# Patient Record
Sex: Male | Born: 1970 | Race: White | Hispanic: No
Health system: Midwestern US, Community
[De-identification: ages and names within clinical notes are randomized; demographics above are authoritative.]

## PROBLEM LIST (undated history)

## (undated) DIAGNOSIS — F329 Major depressive disorder, single episode, unspecified: Secondary | ICD-10-CM

## (undated) DIAGNOSIS — B1920 Unspecified viral hepatitis C without hepatic coma: Secondary | ICD-10-CM

## (undated) DIAGNOSIS — F1910 Other psychoactive substance abuse, uncomplicated: Secondary | ICD-10-CM

## (undated) DIAGNOSIS — F4310 Post-traumatic stress disorder, unspecified: Secondary | ICD-10-CM

## (undated) DIAGNOSIS — F32A Depression, unspecified: Secondary | ICD-10-CM

## (undated) DIAGNOSIS — F3181 Bipolar II disorder: Secondary | ICD-10-CM

## (undated) DIAGNOSIS — E119 Type 2 diabetes mellitus without complications: Secondary | ICD-10-CM

## (undated) DIAGNOSIS — M5030 Other cervical disc degeneration, unspecified cervical region: Secondary | ICD-10-CM

## (undated) HISTORY — PX: BACK SURGERY: SHX140

## (undated) HISTORY — PX: CHOLECYSTECTOMY: SHX55

---

## 2015-05-20 ENCOUNTER — Emergency Department (HOSPITAL_COMMUNITY)
Admission: EM | Admit: 2015-05-20 | Discharge: 2015-05-20 | Disposition: A | Discharge status: Home / Self Care | Payer: Medicare Other | Attending: Emergency Medicine | Admitting: Emergency Medicine

## 2015-05-20 ENCOUNTER — Encounter (HOSPITAL_COMMUNITY): Payer: Self-pay | Admitting: Emergency Medicine

## 2015-05-20 ENCOUNTER — Encounter (HOSPITAL_COMMUNITY): Payer: Self-pay

## 2015-05-20 ENCOUNTER — Emergency Department (HOSPITAL_COMMUNITY)
Admission: EM | Admit: 2015-05-20 | Discharge: 2015-05-21 | Disposition: A | Discharge status: Home / Self Care | Payer: Medicare Other | Attending: Emergency Medicine | Admitting: Emergency Medicine

## 2015-05-20 DIAGNOSIS — R45851 Suicidal ideations: Secondary | ICD-10-CM | POA: Insufficient documentation

## 2015-05-20 DIAGNOSIS — K9189 Other postprocedural complications and disorders of digestive system: Secondary | ICD-10-CM | POA: Diagnosis not present

## 2015-05-20 DIAGNOSIS — F10920 Alcohol use, unspecified with intoxication, uncomplicated: Secondary | ICD-10-CM

## 2015-05-20 DIAGNOSIS — Z590 Homelessness: Secondary | ICD-10-CM | POA: Insufficient documentation

## 2015-05-20 DIAGNOSIS — F1210 Cannabis abuse, uncomplicated: Secondary | ICD-10-CM | POA: Insufficient documentation

## 2015-05-20 DIAGNOSIS — F1310 Sedative, hypnotic or anxiolytic abuse, uncomplicated: Secondary | ICD-10-CM | POA: Diagnosis not present

## 2015-05-20 DIAGNOSIS — F10120 Alcohol abuse with intoxication, uncomplicated: Secondary | ICD-10-CM | POA: Insufficient documentation

## 2015-05-20 DIAGNOSIS — K668 Other specified disorders of peritoneum: Secondary | ICD-10-CM

## 2015-05-20 DIAGNOSIS — F329 Major depressive disorder, single episode, unspecified: Secondary | ICD-10-CM | POA: Diagnosis not present

## 2015-05-20 DIAGNOSIS — T8189XA Other complications of procedures, not elsewhere classified, initial encounter: Secondary | ICD-10-CM

## 2015-05-20 DIAGNOSIS — R1110 Vomiting, unspecified: Secondary | ICD-10-CM | POA: Diagnosis present

## 2015-05-20 HISTORY — DX: Depression, unspecified: F32.A

## 2015-05-20 HISTORY — DX: Major depressive disorder, single episode, unspecified: F32.9

## 2015-05-20 LAB — COMPREHENSIVE METABOLIC PANEL
ALT: 104 U/L — AB (ref 17–63)
AST: 103 U/L — ABNORMAL HIGH (ref 15–41)
Albumin: 3.3 g/dL — ABNORMAL LOW (ref 3.5–5.0)
Alkaline Phosphatase: 86 U/L (ref 38–126)
Anion gap: 12 (ref 5–15)
BUN: 10 mg/dL (ref 6–20)
CALCIUM: 8.9 mg/dL (ref 8.9–10.3)
CHLORIDE: 107 mmol/L (ref 101–111)
CO2: 20 mmol/L — ABNORMAL LOW (ref 22–32)
CREATININE: 0.88 mg/dL (ref 0.61–1.24)
Glucose, Bld: 256 mg/dL — ABNORMAL HIGH (ref 65–99)
Potassium: 4.2 mmol/L (ref 3.5–5.1)
Sodium: 139 mmol/L (ref 135–145)
TOTAL PROTEIN: 6.3 g/dL — AB (ref 6.5–8.1)
Total Bilirubin: 0.5 mg/dL (ref 0.3–1.2)

## 2015-05-20 LAB — CBC
HCT: 39.4 % (ref 39.0–52.0)
Hemoglobin: 13.2 g/dL (ref 13.0–17.0)
MCH: 32.6 pg (ref 26.0–34.0)
MCHC: 33.5 g/dL (ref 30.0–36.0)
MCV: 97.3 fL (ref 78.0–100.0)
PLATELETS: 373 10*3/uL (ref 150–400)
RBC: 4.05 MIL/uL — AB (ref 4.22–5.81)
RDW: 13.5 % (ref 11.5–15.5)
WBC: 9.5 10*3/uL (ref 4.0–10.5)

## 2015-05-20 LAB — ACETAMINOPHEN LEVEL: Acetaminophen (Tylenol), Serum: <10 ug/mL — ABNORMAL LOW (ref 10–30)

## 2015-05-20 LAB — SALICYLATE LEVEL

## 2015-05-20 LAB — ETHANOL

## 2015-05-20 NOTE — ED Notes (Signed)
wanded by security, staffing called for sitter, will send when one is available.  

## 2015-05-20 NOTE — ED Notes (Signed)
Pt from home for eval of SI that started yesterday, pt states has recently been released from jail and is homeless, pt states "everythiing is happening at once and I dont know what to do. I dont want to be here anymore." pt states plan of getting drunk and running out in front of traffic. Pt tearful in triage, calm and cooperative.

## 2015-05-20 NOTE — ED Notes (Signed)
Pt requesting to leave, states he will go to [facility] because he wants to be in a room and lay down. Pt informed would have to start process all over again when going to different ED. Pt states "I don't care" pt given belongings, staffing office made aware and charge RN. Pt ambulatory out of ED.

## 2015-05-20 NOTE — ED Notes (Signed)
[place]. C/o vomiting dark, brown blood and abdominal tenderness. Hx of gallbladder surgery on 2/14. Pt went out to drink alcohol with friends tonight. "Vomited after a few beers." Surgical sites in tact and not inflamed or red upon inspection.

## 2015-05-21 ENCOUNTER — Emergency Department (HOSPITAL_COMMUNITY): Payer: Medicare Other

## 2015-05-21 ENCOUNTER — Encounter (HOSPITAL_COMMUNITY): Payer: Self-pay

## 2015-05-21 DIAGNOSIS — F1210 Cannabis abuse, uncomplicated: Secondary | ICD-10-CM | POA: Diagnosis not present

## 2015-05-21 DIAGNOSIS — R45851 Suicidal ideations: Secondary | ICD-10-CM | POA: Diagnosis not present

## 2015-05-21 DIAGNOSIS — K9189 Other postprocedural complications and disorders of digestive system: Secondary | ICD-10-CM | POA: Diagnosis not present

## 2015-05-21 DIAGNOSIS — F1310 Sedative, hypnotic or anxiolytic abuse, uncomplicated: Secondary | ICD-10-CM | POA: Diagnosis not present

## 2015-05-21 DIAGNOSIS — F10120 Alcohol abuse with intoxication, uncomplicated: Secondary | ICD-10-CM | POA: Diagnosis not present

## 2015-05-21 DIAGNOSIS — R1110 Vomiting, unspecified: Secondary | ICD-10-CM | POA: Diagnosis present

## 2015-05-21 DIAGNOSIS — F329 Major depressive disorder, single episode, unspecified: Secondary | ICD-10-CM | POA: Diagnosis not present

## 2015-05-21 LAB — COMPREHENSIVE METABOLIC PANEL
ALBUMIN: 3.5 g/dL (ref 3.5–5.0)
ALK PHOS: 99 U/L (ref 38–126)
ALT: 110 U/L — AB (ref 17–63)
AST: 131 U/L — AB (ref 15–41)
Anion gap: 12 (ref 5–15)
BUN: 9 mg/dL (ref 6–20)
CALCIUM: 8.7 mg/dL — AB (ref 8.9–10.3)
CO2: 20 mmol/L — AB (ref 22–32)
CREATININE: 0.84 mg/dL (ref 0.61–1.24)
Chloride: 109 mmol/L (ref 101–111)
GFR calc non Af Amer: >60 mL/min (ref >60)
GLUCOSE: 128 mg/dL — AB (ref 65–99)
Potassium: 4 mmol/L (ref 3.5–5.1)
SODIUM: 141 mmol/L (ref 135–145)
Total Bilirubin: 0.5 mg/dL (ref 0.3–1.2)
Total Protein: 6.4 g/dL — ABNORMAL LOW (ref 6.5–8.1)

## 2015-05-21 LAB — CBC WITH DIFFERENTIAL/PLATELET
Basophils Absolute: 0.1 10*3/uL (ref 0.0–0.1)
Basophils Relative: 1 %
EOS ABS: 0.1 10*3/uL (ref 0.0–0.7)
Eosinophils Relative: 1 %
HCT: 38.3 % — ABNORMAL LOW (ref 39.0–52.0)
HEMOGLOBIN: 12.8 g/dL — AB (ref 13.0–17.0)
LYMPHS ABS: 4.2 10*3/uL — AB (ref 0.7–4.0)
Lymphocytes Relative: 40 %
MCH: 32.3 pg (ref 26.0–34.0)
MCHC: 33.4 g/dL (ref 30.0–36.0)
MCV: 96.7 fL (ref 78.0–100.0)
Monocytes Absolute: 0.8 10*3/uL (ref 0.1–1.0)
Monocytes Relative: 8 %
NEUTROS PCT: 50 %
Neutro Abs: 5.2 10*3/uL (ref 1.7–7.7)
Platelets: 402 10*3/uL — ABNORMAL HIGH (ref 150–400)
RBC: 3.96 MIL/uL — AB (ref 4.22–5.81)
RDW: 13.2 % (ref 11.5–15.5)
WBC: 10.5 10*3/uL (ref 4.0–10.5)

## 2015-05-21 LAB — RAPID URINE DRUG SCREEN, HOSP PERFORMED
Amphetamines: NOT DETECTED
Barbiturates: NOT DETECTED
Benzodiazepines: POSITIVE — AB
Cocaine: NOT DETECTED
OPIATES: NOT DETECTED
TETRAHYDROCANNABINOL: POSITIVE — AB

## 2015-05-21 LAB — ACETAMINOPHEN LEVEL: Acetaminophen (Tylenol), Serum: <10 ug/mL — ABNORMAL LOW (ref 10–30)

## 2015-05-21 LAB — ETHANOL: Alcohol, Ethyl (B): 127 mg/dL — ABNORMAL HIGH (ref <5)

## 2015-05-21 LAB — LIPASE, BLOOD: LIPASE: 30 U/L (ref 11–51)

## 2015-05-21 LAB — POC OCCULT BLOOD, ED: FECAL OCCULT BLD: NEGATIVE

## 2015-05-21 LAB — SALICYLATE LEVEL

## 2015-05-21 LAB — CBG MONITORING, ED: Glucose-Capillary: 112 mg/dL — ABNORMAL HIGH (ref 65–99)

## 2015-05-21 MED ORDER — METOCLOPRAMIDE HCL 5 MG/ML IJ SOLN
10.0000 mg | INTRAMUSCULAR | Status: AC
  Administered 2015-05-21: 10 mg via INTRAVENOUS
  Filled 2015-05-21: qty 2

## 2015-05-21 MED ORDER — FAMOTIDINE IN NACL 20-0.9 MG/50ML-% IV SOLN
20.0000 mg | Freq: Once | INTRAVENOUS | Status: AC
  Administered 2015-05-21: 20 mg via INTRAVENOUS
  Filled 2015-05-21: qty 50

## 2015-05-21 MED ORDER — HYDROMORPHONE HCL 1 MG/ML IJ SOLN
1.0000 mg | Freq: Once | INTRAMUSCULAR | Status: AC
  Administered 2015-05-21: 1 mg via INTRAMUSCULAR
  Filled 2015-05-21: qty 1

## 2015-05-21 MED ORDER — IOHEXOL 300 MG/ML  SOLN
100.0000 mL | Freq: Once | INTRAMUSCULAR | Status: AC | PRN
  Administered 2015-05-21: 100 mL via INTRAVENOUS

## 2015-05-21 MED ORDER — SODIUM CHLORIDE 0.9 % IV BOLUS (SEPSIS)
1000.0000 mL | Freq: Once | INTRAVENOUS | Status: AC
Start: 2015-05-21 — End: 2015-05-21
  Administered 2015-05-21: 1000 mL via INTRAVENOUS

## 2015-05-21 MED ORDER — HYDROMORPHONE HCL 1 MG/ML IJ SOLN
1.0000 mg | Freq: Once | INTRAMUSCULAR | Status: AC
  Administered 2015-05-21: 1 mg via INTRAVENOUS
  Filled 2015-05-21: qty 1

## 2015-05-21 MED ORDER — IOHEXOL 300 MG/ML  SOLN
50.0000 mL | Freq: Once | INTRAMUSCULAR | Status: AC | PRN
  Administered 2015-05-21: 50 mL via ORAL

## 2015-05-21 MED ORDER — MORPHINE SULFATE (PF) 4 MG/ML IV SOLN
4.0000 mg | Freq: Once | INTRAVENOUS | Status: AC
  Administered 2015-05-21: 4 mg via INTRAVENOUS
  Filled 2015-05-21: qty 1

## 2015-05-21 MED ORDER — DICYCLOMINE HCL 10 MG/ML IM SOLN
20.0000 mg | Freq: Once | INTRAMUSCULAR | Status: AC
  Administered 2015-05-21: 20 mg via INTRAMUSCULAR
  Filled 2015-05-21: qty 2

## 2015-05-21 MED ORDER — MORPHINE SULFATE (PF) 4 MG/ML IV SOLN
4.0000 mg | Freq: Once | INTRAVENOUS | Status: AC
  Administered 2015-05-21: 4 mg via INTRAMUSCULAR
  Filled 2015-05-21: qty 1

## 2015-05-21 MED ORDER — PANTOPRAZOLE SODIUM 40 MG IV SOLR
40.0000 mg | INTRAVENOUS | Status: AC
  Administered 2015-05-21: 40 mg via INTRAVENOUS
  Filled 2015-05-21: qty 40

## 2015-05-21 NOTE — ED Notes (Signed)
Bed: [facility] Expected date:  Expected time:  Means of arrival:  Comments: 

## 2015-05-21 NOTE — ED Notes (Signed)
During triage, pt expresses suicidal ideations.

## 2015-05-21 NOTE — ED Notes (Signed)
Pt belongings removed from room. They are at nurses station. One black book bag and on patient belongings bag containing a gray hoodie, a wallet, and jeans

## 2015-05-21 NOTE — ED Provider Notes (Signed)
CSN: [id number]     Arrival date & time 05/20/15  2350 History   First MD Initiated Contact with Patient 05/21/15 0100     Chief Complaint  Patient presents with  . Abdominal Pain  . Emesis     (Consider location/radiation/quality/duration/timing/severity/associated sxs/prior Treatment) HPI Comments: Patient is a 45 year old male with a history of depression who is 7 days status post cholecystectomy who presents to the ED for evaluation of constant, diffuse, aching, abdominal pain and hematemesis. Patient states that he has had similar abdominal pain every day since his surgery. He has been taking Percocet for this. Patient reports "drinking a few beers" with some friends tonight. He states that he had one episode of brown, dark bloody emesis prior to arrival. He states that he had a normal bowel movement today which was free of melena or hematochezia. He is also complaining of suicidal ideations. He has a plan to "get drunk and brought into traffic". He reports a history of suicide attempt by cutting. Patient denies fever, illicit drug use, urinary symptoms, or a hx of other abdominal surgeries. He states that he drinks 1/5 of liquor per day.  Surgery done in [place], [place] at [facility]. Surgeon, Dr. Adaline Sill.  The history is provided by the patient. No language interpreter was used.    Past Medical History  Diagnosis Date  . Depression    Past Surgical History  Procedure Laterality Date  . Cholecystectomy     History reviewed. No pertinent family history. Social History  Substance Use Topics  . Smoking status: Never Smoker   . Smokeless tobacco: None  . Alcohol Use: Yes     Comment: etoh abuse    Review of Systems  Constitutional: Negative for fever.  Gastrointestinal: Positive for nausea, vomiting and abdominal pain. Negative for diarrhea, constipation and blood in stool.  Genitourinary: Negative for dysuria.  Psychiatric/Behavioral: Positive for suicidal ideas  and behavioral problems.  All other systems reviewed and are negative.   Allergies  Seroquel and Tylenol  Home Medications   Prior to Admission medications   Not on File   BP 111/75 mmHg  Pulse 102  Temp(Src) 98.4 F (36.9 C) (Oral)  Resp 21  SpO2 96%   Physical Exam  Constitutional: He is oriented to person, place, and time. He appears well-developed and well-nourished. No distress.  Nontoxic/nonseptic appearing  HENT:  Head: Normocephalic and atraumatic.  Eyes: Conjunctivae and EOM are normal. No scleral icterus.  Neck: Normal range of motion.  Cardiovascular: Normal rate, regular rhythm and intact distal pulses.   Pulmonary/Chest: Effort normal and breath sounds normal. No respiratory distress. He has no wheezes. He has no rales.  Lungs CTAB  Abdominal: Soft. He exhibits no distension. There is tenderness. There is no rebound and no guarding.  Diffuse TTP. Abdomen soft, obese. No rigidity. No peritoneal signs. Bowel sounds slightly hypoactive. Surgical scars are C/D/I  Musculoskeletal: Normal range of motion.  Neurological: He is alert and oriented to person, place, and time. He exhibits normal muscle tone. Coordination normal.  Skin: Skin is warm and dry. No rash noted. He is not diaphoretic. No erythema. No pallor.  Psychiatric: His speech is normal. He is withdrawn. He exhibits a depressed mood. He expresses suicidal ideation. He expresses no homicidal ideation. He expresses suicidal plans. He expresses no homicidal plans.  Nursing note and vitals reviewed.   ED Course  Procedures (including critical care time) Labs Review Labs Reviewed  ACETAMINOPHEN LEVEL - Abnormal; Notable  for the following:    Acetaminophen (Tylenol), Serum <10 (*)    All other components within normal limits  URINE RAPID DRUG SCREEN, HOSP PERFORMED - Abnormal; Notable for the following:    Benzodiazepines POSITIVE (*)    Tetrahydrocannabinol POSITIVE (*)    All other components within  normal limits  ETHANOL - Abnormal; Notable for the following:    Alcohol, Ethyl (B) 127 (*)    All other components within normal limits  CBC WITH DIFFERENTIAL/PLATELET - Abnormal; Notable for the following:    RBC 3.96 (*)    Hemoglobin 12.8 (*)    HCT 38.3 (*)    Platelets 402 (*)    Lymphs Abs 4.2 (*)    All other components within normal limits  COMPREHENSIVE METABOLIC PANEL - Abnormal; Notable for the following:    CO2 20 (*)    Glucose, Bld 128 (*)    Calcium 8.7 (*)    Total Protein 6.4 (*)    AST 131 (*)    ALT 110 (*)    All other components within normal limits  CBG MONITORING, ED - Abnormal; Notable for the following:    Glucose-Capillary 112 (*)    All other components within normal limits  LIPASE, BLOOD  SALICYLATE LEVEL  POC OCCULT BLOOD, ED     Imaging Review Ct Abdomen Pelvis W Contrast  05/21/2015  CLINICAL DATA:  Abdominal pain and tenderness. Cholecystectomy 1 week prior. EXAM: CT ABDOMEN AND PELVIS WITH CONTRAST TECHNIQUE: Multidetector CT imaging of the abdomen and pelvis was performed using the standard protocol following bolus administration of intravenous contrast. CONTRAST:  OMNIPAQUE IOHEXOL 300 MG/ML  SOLN COMPARISON:  None. FINDINGS: Lower chest:  The included lung bases are clear. Liver: No focal lesion.  No intrahepatic fluid collection. Hepatobiliary: Postcholecystectomy with clips in the gallbladder fossa. Irregularly-shaped fluid collection in the gallbladder fossa measuring 6.7 x 5.0 x 4.7 cm contains small foci of air. No definite surrounding thick wall. Common bile duct measures 11 mm at the porta hepatis with normal tapering distally. No calcified choledocholithiasis. Pancreas: No ductal dilatation or inflammation. Spleen: Normal. Adrenal glands: No nodule. Kidneys: Symmetric renal enhancement and excretion. No hydronephrosis. Questionable nonobstructing stones in the left kidney versus early contrast excretion. Stomach/Bowel: Stomach  physiologically distended. There are no dilated or thickened small bowel loops. Small volume of stool throughout the colon without colonic wall thickening. The appendix is normal. Vascular/Lymphatic: No retroperitoneal adenopathy. Abdominal aorta is normal in caliber. Reproductive: Normal for age. Bladder: Physiologically distended. Other: New free intra-abdominal air. No ascites. Fat within both inguinal canals, left greater than right. Postsurgical change in the anterior abdominal wall without subcutaneous fluid collection. Musculoskeletal: There are no acute or suspicious osseous abnormalities. Postsurgical change at L5-S1. The left L5 pedicle screw abuts the superior endplate/intervertebral disc space. IMPRESSION: 1. Post recent cholecystectomy with fluid collection measuring 6.7 x 5.0 x 4.7 cm in the gallbladder fossa containing small foci of air. Abscess versus biloma. 2. No additional acute abnormality in the abdomen/pelvis. 3. Post a fusion at L5-S1, the left L5 pedicle screw abuts the superior endplate/intervertebral disc space of L4-L5, no prior exams for comparison. Electronically Signed   By: Rubye Oaks M.D.   On: 05/21/2015 03:58     I have personally reviewed and evaluated these images and lab results as part of my medical decision-making.   EKG Interpretation None      5:29 AM Spoke with Dr. Adaline Sill, surgeon from [facility].  Dr. Adaline Sill has agreed to accept the patient in transfer, especially seeing as patient's symptoms are associated with a recent surgery performed at this hospital. Plan to transfer via [facility] which cannot occur until after 8AM. Dr. Adaline Sill requests that we contact the [facility] at [phone] on patient departure from the department to discuss process of direct admission to the floor.  MDM   Final diagnoses:  Biloma following surgery, initial encounter  Suicidal ideations  Alcohol intoxication, uncomplicated (HCC)    Patient to be transferred to  [facility] for further management of likely biloma or abscess secondary to a laparoscopic cholecystectomy 1 week ago. Patient also complaining of suicidal ideations; no psychiatric evaluation completed as patient has not been medically cleared with this condition. [facility] does have a psychiatric unit with whom the patient can follow-up. Transfer to occur via [facility] or [facility]. Secretary is coordinating transfer. EMTALA completed.   Filed Vitals:   05/20/15 2357 05/21/15 0554  BP: 111/75 122/52  Pulse: 102 89  Temp: 98.4 F (36.9 C) 98.4 F (36.9 C)  TempSrc: Oral Oral  Resp: 21 19  SpO2: 96% 96%     Antony Madura, PA-C 05/21/15 0981  April Palumbo, MD 05/21/15 [phone]

## 2015-05-21 NOTE — ED Notes (Signed)
PA at bedside.

## 2015-05-21 NOTE — ED Notes (Signed)
Pt requesting pain meds, PA notified.

## 2015-05-21 NOTE — ED Notes (Signed)
[facility] notified for transport when bed is available.  Pt information faxed to facility per their request.

## 2015-05-21 NOTE — ED Provider Notes (Signed)
  Physical Exam  BP 122/52 mmHg  Pulse 89  Temp(Src) 98.4 F (36.9 C) (Oral)  Resp 19  SpO2 96%  Physical Exam  ED Course  Procedures  MDM Sign out from Antony Madura, PA-C Patient to be DCed to [facility] for likely biloma or abscess secondary to Lap Chole x1 week ago Transfer [facility] or [facility] Follow up with Dr. Adaline Sill at [facility]   8:31 AM- Upon questioning of patient, he is not actively suicidal or homicidal. Does not endorse a plan. Does not endorse and auditory or visual hallucinations.        Audry Pili, PA-C 05/21/15 4259  April Palumbo, MD 06/05/15 [phone]

## 2015-07-09 ENCOUNTER — Encounter (HOSPITAL_COMMUNITY): Payer: Self-pay | Admitting: Emergency Medicine

## 2015-07-09 ENCOUNTER — Emergency Department (HOSPITAL_COMMUNITY): Payer: Medicare Other

## 2015-07-09 ENCOUNTER — Emergency Department (HOSPITAL_COMMUNITY)
Admission: EM | Admit: 2015-07-09 | Discharge: 2015-07-09 | Disposition: A | Discharge status: Home / Self Care | Payer: Medicare Other | Attending: Emergency Medicine | Admitting: Emergency Medicine

## 2015-07-09 DIAGNOSIS — Z9889 Other specified postprocedural states: Secondary | ICD-10-CM | POA: Insufficient documentation

## 2015-07-09 DIAGNOSIS — S3992XA Unspecified injury of lower back, initial encounter: Secondary | ICD-10-CM | POA: Diagnosis present

## 2015-07-09 DIAGNOSIS — W19XXXA Unspecified fall, initial encounter: Secondary | ICD-10-CM

## 2015-07-09 DIAGNOSIS — Y998 Other external cause status: Secondary | ICD-10-CM | POA: Diagnosis not present

## 2015-07-09 DIAGNOSIS — Z794 Long term (current) use of insulin: Secondary | ICD-10-CM | POA: Diagnosis not present

## 2015-07-09 DIAGNOSIS — M25551 Pain in right hip: Secondary | ICD-10-CM

## 2015-07-09 DIAGNOSIS — M549 Dorsalgia, unspecified: Secondary | ICD-10-CM

## 2015-07-09 DIAGNOSIS — Y9301 Activity, walking, marching and hiking: Secondary | ICD-10-CM | POA: Insufficient documentation

## 2015-07-09 DIAGNOSIS — S7001XA Contusion of right hip, initial encounter: Secondary | ICD-10-CM | POA: Insufficient documentation

## 2015-07-09 DIAGNOSIS — Z7984 Long term (current) use of oral hypoglycemic drugs: Secondary | ICD-10-CM | POA: Insufficient documentation

## 2015-07-09 DIAGNOSIS — Y9289 Other specified places as the place of occurrence of the external cause: Secondary | ICD-10-CM | POA: Diagnosis not present

## 2015-07-09 DIAGNOSIS — F329 Major depressive disorder, single episode, unspecified: Secondary | ICD-10-CM | POA: Diagnosis not present

## 2015-07-09 DIAGNOSIS — M5440 Lumbago with sciatica, unspecified side: Secondary | ICD-10-CM | POA: Insufficient documentation

## 2015-07-09 DIAGNOSIS — Z79899 Other long term (current) drug therapy: Secondary | ICD-10-CM | POA: Diagnosis not present

## 2015-07-09 DIAGNOSIS — W108XXA Fall (on) (from) other stairs and steps, initial encounter: Secondary | ICD-10-CM | POA: Insufficient documentation

## 2015-07-09 DIAGNOSIS — M5441 Lumbago with sciatica, right side: Secondary | ICD-10-CM

## 2015-07-09 MED ORDER — MORPHINE SULFATE (PF) 2 MG/ML IV SOLN
2.0000 mg | Freq: Once | INTRAVENOUS | Status: AC
  Administered 2015-07-09: 2 mg via INTRAMUSCULAR
  Filled 2015-07-09: qty 1

## 2015-07-09 MED ORDER — ONDANSETRON 4 MG PO TBDP
4.0000 mg | ORAL_TABLET | Freq: Once | ORAL | Status: AC
  Administered 2015-07-09: 4 mg via ORAL
  Filled 2015-07-09: qty 1

## 2015-07-09 MED ORDER — HYDROMORPHONE HCL 1 MG/ML IJ SOLN
1.0000 mg | Freq: Once | INTRAMUSCULAR | Status: AC
  Administered 2015-07-09: 1 mg via INTRAMUSCULAR
  Filled 2015-07-09: qty 1

## 2015-07-09 MED ORDER — IBUPROFEN 800 MG PO TABS: 800.0000 mg | ORAL_TABLET | Freq: Three times a day (TID) | ORAL | Status: DC

## 2015-07-09 MED ORDER — OXYCODONE-ACETAMINOPHEN 5-325 MG PO TABS
1.0000 | ORAL_TABLET | Freq: Once | ORAL | Status: AC
  Administered 2015-07-09: 1 via ORAL
  Filled 2015-07-09: qty 1

## 2015-07-09 MED ORDER — METHOCARBAMOL 500 MG PO TABS: 500.0000 mg | ORAL_TABLET | Freq: Two times a day (BID) | ORAL | Status: DC | PRN

## 2015-07-09 NOTE — ED Notes (Signed)
Per opt, states fell carrying a cooler of food-complaining of right leg pain

## 2015-07-09 NOTE — Discharge Instructions (Signed)
Back Injury Prevention Back injuries can be very painful. They can also be difficult to heal. After having one back injury, you are more likely to injure your back again. It is important to learn how to avoid injuring or re-injuring your back. The following tips can help you to prevent a back injury. WHAT SHOULD I KNOW ABOUT PHYSICAL FITNESS?  Exercise for 30 minutes per day on most days of the week or as directed by your health care provider. Make sure to:  Do aerobic exercises, such as walking, jogging, biking, or swimming.  Do exercises that increase balance and strength, such as tai chi and yoga. These can decrease your risk of falling and injuring your back.  Do stretching exercises to help with flexibility.  Try to develop strong abdominal muscles. Your abdominal muscles provide a lot of the support that is needed by your back.  Maintain a healthy weight. This helps to decrease your risk of a back injury. WHAT SHOULD I KNOW ABOUT MY DIET?  Talk with your health care provider about your overall diet. Take supplements and vitamins only as directed by your health care provider.  Talk with your health care provider about how much calcium and vitamin D you need each day. These nutrients help to prevent weakening of the bones (osteoporosis). Osteoporosis can cause broken (fractured) bones, which lead to back pain.  Include good sources of calcium in your diet, such as dairy products, green leafy vegetables, and products that have had calcium added to them (fortified).  Include good sources of vitamin D in your diet, such as milk and foods that are fortified with vitamin D. WHAT SHOULD I KNOW ABOUT MY POSTURE?  Sit up straight and stand up straight. Avoid leaning forward when you sit or hunching over when you stand.  Choose chairs that have good low-back (lumbar) support.  If you work at a desk, sit close to it so you do not need to lean over. Keep your chin tucked in. Keep your neck  drawn back, and keep your elbows bent at a right angle. Your arms should look like the letter "L."  Sit high and close to the steering wheel when you drive. Add a lumbar support to your car seat, if needed.  Avoid sitting or standing in one position for very long. Take breaks to get up, stretch, and walk around at least one time every hour. Take breaks every hour if you are driving for long periods of time.  Sleep on your side with your knees slightly bent, or sleep on your back with a pillow under your knees. Do not lie on the front of your body to sleep. WHAT SHOULD I KNOW ABOUT LIFTING, TWISTING, AND REACHING? Lifting and Heavy Lifting  Avoid heavy lifting, especially repetitive heavy lifting. If you must do heavy lifting:  Stretch before lifting.  Work slowly.  Rest between lifts.  Use a tool such as a cart or a dolly to move objects if one is available.  Make several small trips instead of carrying one heavy load.  Ask for help when you need it, especially when moving big objects.  Follow these steps when lifting:  Stand with your feet shoulder-width apart.  Get as close to the object as you can. Do not try to pick up a heavy object that is far from your body.  Use handles or lifting straps if they are available.  Bend at your knees. Squat down, but keep your heels off the floor.  Keep your shoulders pulled back, your chin tucked in, and your back straight.  Lift the object slowly while you tighten the muscles in your legs, abdomen, and buttocks. Keep the object as close to the center of your body as possible.  Follow these steps when putting down a heavy load:  Stand with your feet shoulder-width apart.  Lower the object slowly while you tighten the muscles in your legs, abdomen, and buttocks. Keep the object as close to the center of your body as possible.  Keep your shoulders pulled back, your chin tucked in, and your back straight.  Bend at your knees. Squat  down, but keep your heels off the floor.  Use handles or lifting straps if they are available. Twisting and Reaching  Avoid lifting heavy objects above your waist.  Do not twist at your waist while you are lifting or carrying a load. If you need to turn, move your feet.  Do not bend over without bending at your knees.  Avoid reaching over your head, across a table, or for an object on a high surface. WHAT ARE SOME OTHER TIPS?  Avoid wet floors and icy ground. Keep sidewalks clear of ice to prevent falls.  Do not sleep on a mattress that is too soft or too hard.  Keep items that are used frequently within easy reach.  Put heavier objects on shelves at waist level, and put lighter objects on lower or higher shelves.  Find ways to decrease your stress, such as exercise, massage, or relaxation techniques. Stress can build up in your muscles. Tense muscles are more vulnerable to injury.  Talk with your health care provider if you feel anxious or depressed. These conditions can make back pain worse.  Wear flat heel shoes with cushioned soles.  Avoid sudden movements.  Use both shoulder straps when carrying a backpack.  Do not use any tobacco products, including cigarettes, chewing tobacco, or electronic cigarettes. If you need help quitting, ask your health care provider.   This information is not intended to replace advice given to you by your health care provider. Make sure you discuss any questions you have with your health care provider.   Document Released: 04/23/2004 Document Revised: 07/31/2014 Document Reviewed: 03/20/2014 Elsevier Interactive Patient Education [employer].

## 2015-07-09 NOTE — ED Notes (Signed)
Pt placed on pulse ox machine.

## 2015-07-09 NOTE — ED Provider Notes (Signed)
CSN: [id number]     Arrival date & time 07/09/15  1200 History  By signing my name below, I, Gary Boone, attest that this documentation has been prepared under the direction and in the presence of [facility], PA-C. Electronically Signed: Ronney Boone, ED Scribe. 07/09/2015. 12:33 PM.    Chief Complaint  Patient presents with  . Fall   The history is provided by the patient. No language interpreter was used.   HPI Comments: Gary Boone is a 45 y.o. male with a history of L5 spinal fusion in 2004, who presents to the Emergency Department complaining of constant, worsening, 8/10, aching right hip pain radiating down his entire right legn S/P falling down 6 steps 2 days ago. Patient states he was walking down steps with a cooler of food when he missed a step and fell down 6 steps, landing with all of the weight on the right side of his body. He states he felt immediate, severe pain in his right leg and laid down for a while before getting up to walk around. He states he felt the pain was tolerable the next day, but today, he woke up in his morning and noticed his right hip was black and blue and his pain had acutely worsened to a severe level, to the point that he feels unable to bear weight on his right hip, secondary to his pain. Movement, palpation, and bearing weight all exacerbate his pain. No treatments were noted. He denies any fever, bowel or bladder incontinence, or saddle anesthesia.   Past Medical History  Diagnosis Date  . Depression    Past Surgical History  Procedure Laterality Date  . Cholecystectomy     No family history on file. Social History  Substance Use Topics  . Smoking status: Never Smoker   . Smokeless tobacco: None  . Alcohol Use: Yes     Comment: etoh abuse    Review of Systems  Constitutional: Negative for fever.  HENT: Negative for congestion.   Eyes: Negative for visual disturbance.  Respiratory: Negative for cough and shortness of breath.   Cardiovascular:  Negative for chest pain.  Gastrointestinal: Negative for vomiting and abdominal pain.       Negative for bowel incontinence.   Genitourinary: Negative for dysuria.       Negative for bladder incontinence.   Musculoskeletal: Positive for arthralgias (right hip pain).  Skin: Positive for color change.  Neurological: Negative for numbness.   Allergies  Seroquel and Tylenol  Home Medications   Prior to Admission medications   Medication Sig Start Date End Date Taking? Authorizing Provider  FLUoxetine (PROZAC) 20 MG capsule Take 1 capsule by mouth daily. 05/07/15   Historical Provider, MD  gabapentin (NEURONTIN) 800 MG tablet Take 1 tablet by mouth 3 (three) times daily. 05/07/15   Historical Provider, MD  ibuprofen (ADVIL,MOTRIN) 800 MG tablet Take 1 tablet (800 mg total) by mouth 3 (three) times daily. 07/09/15   Chase Picket Farra Nikolic, PA-C  insulin aspart protamine- aspart (NOVOLOG MIX 70/30) (70-30) 100 UNIT/ML injection Inject into the skin daily.    Historical Provider, MD  metFORMIN (GLUCOPHAGE) 500 MG tablet Take 1,000 mg by mouth 2 (two) times daily. 05/07/15   Historical Provider, MD  methocarbamol (ROBAXIN) 500 MG tablet Take 1 tablet (500 mg total) by mouth 2 (two) times daily as needed for muscle spasms. 07/09/15   [facility] Picket Elsie Baynes, PA-C  oxyCODONE-acetaminophen (PERCOCET/ROXICET) 5-325 MG tablet Take 1 tablet by mouth every 6 (six) hours  as needed for moderate pain.  05/15/15   Historical Provider, MD   BP 159/101 mmHg  Pulse 105  Temp(Src) 98.4 F (36.9 C) (Oral)  Resp 18  SpO2 97% Physical Exam  Constitutional: He is oriented to person, place, and time. He appears well-developed and well-nourished. No distress.   Appears in pain but NAD  HENT:  Head: Normocephalic and atraumatic.  Eyes: Conjunctivae and EOM are normal.  Neck: Neck supple. No tracheal deviation present.  Full ROM without pain No midline tenderness No tenderness of paraspinal musculature  Cardiovascular: Normal  rate, regular rhythm, normal heart sounds and intact distal pulses.  Exam reveals no gallop and no friction rub.   No murmur heard. Pulmonary/Chest: Effort normal and breath sounds normal. No respiratory distress. He has no wheezes. He has no rales. He exhibits no tenderness.  Abdominal: Soft. Bowel sounds are normal. He exhibits no distension. There is no tenderness.  Musculoskeletal: Normal range of motion.  Well-healed surgical incision of over lumbar spine. No erythema or surrounding skin changes.  Curvature of cervical, thoracic, and lumbar spine within normal limits. Tenderness to palpation of l-spine and lumbar paraspinal musculature. Decreased ROM secondary to pain. Straight leg raises are positive on right for radicular symptoms. 5/5 muscle strength of bilateral LE's   Neurological: He is alert and oriented to person, place, and time. He has normal reflexes.  Decreased sensation to right lower extremity.   Skin: Skin is warm and dry. No rash noted. No erythema.  Bruising to the right lateral hip.   Psychiatric: He has a normal mood and affect. His behavior is normal.  Nursing note and vitals reviewed.   ED Course  Procedures (including critical care time)  DIAGNOSTIC STUDIES: Oxygen Saturation is 97% on RA, normal by my interpretation.    COORDINATION OF CARE: 12:31 PM - Discussed treatment plan with pt at bedside which includes pain medication administered here and right hip XR. Pt verbalized understanding and agreed to plan.   Imaging Review Dg Thoracic Spine W/swimmers  07/09/2015  CLINICAL DATA:  45 year old male who fell downstairs several days ago while carrying a cooler. Pain and ecchymosis. Initial encounter. EXAM: THORACIC SPINE - 3 VIEWS COMPARISON:  Lumbar radiographs from today reported separately. CT Abdomen and Pelvis report 05/21/2015 (no images available). FINDINGS: Normal thoracic segmentation. Normal thoracic vertebral height and alignment. Cervicothoracic  junction alignment is within normal limits. Negative visualized thoracic visceral contours. L1 level appears intact. Cholecystectomy clips in the right upper quadrant. IMPRESSION: No acute fracture or listhesis identified in the thoracic spine. Electronically Signed   By: Odessa FlemingH  Hall M.D.   On: 07/09/2015 13:43   Dg Lumbar Spine Complete  07/09/2015  CLINICAL DATA:  Fall couple of days ago, back pain, right leg pain EXAM: LUMBAR SPINE - COMPLETE 4+ VIEW COMPARISON:  05/21/2015 FINDINGS: Five views of the lumbar spine submitted. No acute fracture or subluxation. Again noted posterior fusion at L5-S1 level. The alignment is preserved. IMPRESSION: No acute fracture or subluxation. Again noted posterior fusion at L5-S1 level with alignment preserved. Electronically Signed   By: Natasha MeadLiviu  Pop M.D.   On: 07/09/2015 13:36   Dg Hip Unilat With Pelvis 2-3 Views Right  07/09/2015  CLINICAL DATA:  45 year old male who fell downstairs several days ago with ecchymosis and pain. Initial encounter. EXAM: DG HIP (WITH OR WITHOUT PELVIS) 2-3V RIGHT COMPARISON:  Lumbar radiographs from today reported separately. CT Abdomen and Pelvis 05/21/2015 report (no images available). FINDINGS: Lumbosacral fusion hardware re-  demonstrated. Femoral heads are normally located. Hip joint spaces are preserved. Pelvis intact. SI joints appear normal. Proximal left femur appears grossly intact. Proximal right femur intact. IMPRESSION: No acute fracture or dislocation identified about the right hip or pelvis. Electronically Signed   By: Odessa Fleming M.D.   On: 07/09/2015 13:44   I have personally reviewed and evaluated these images and lab results as part of my medical decision-making.  MDM   Final diagnoses:  Fall, initial encounter  Right-sided low back pain with sciatica, sciatica laterality unspecified   Cloud Graham presents to the ED for back pain after fall. On exam patient with increased sensation of the right lower extremity and  tenderness to palpation of the L-spine and paraspinal musculature. Patient is also tender to palpation over the right hip with mild bruising to the area. X-rays were obtained of the T-spine, L-spine, and hips/pelvis which were all unremarkable. No loss of bowel or bladder control. No concern for cauda equina. No fever, night sweats, weight loss, h/o cancer, IVDU. A chronic controlled substance database was consulted which shows the patient received 6 prescriptions all for pain medication him multiple different cities since 04/30/2015. Patient states he is followed by a surgeon in [place], and it was recommended to patient to follow up with surgeon for further evaluation, possibly MRI, as an outpatient. We will manage pain with Robaxin and anti-inflammatories. Home care instructions were discussed including ice/heat. Return precautions were discussed. All questions were answered.  Patient seen by and discussed with Dr. Patria Mane who agrees with treatment plan.   I personally performed the services described in this documentation, which was scribed in my presence. The recorded information has been reviewed and is accurate.    [facility] Irelyn Perfecto, PA-C 07/09/15 2011  Azalia Bilis, MD 07/10/15 [phone]

## 2015-07-09 NOTE — ED Notes (Signed)
MD and PA at bedside.  

## 2015-08-08 ENCOUNTER — Emergency Department (HOSPITAL_COMMUNITY)
Admission: EM | Admit: 2015-08-08 | Discharge: 2015-08-09 | Disposition: A | Discharge status: Other Acute Inpatient Hospital | Payer: Medicare Other | Attending: Emergency Medicine | Admitting: Emergency Medicine

## 2015-08-08 ENCOUNTER — Encounter (HOSPITAL_COMMUNITY): Payer: Self-pay | Admitting: Emergency Medicine

## 2015-08-08 DIAGNOSIS — R109 Unspecified abdominal pain: Secondary | ICD-10-CM | POA: Insufficient documentation

## 2015-08-08 DIAGNOSIS — F419 Anxiety disorder, unspecified: Secondary | ICD-10-CM | POA: Diagnosis not present

## 2015-08-08 DIAGNOSIS — E119 Type 2 diabetes mellitus without complications: Secondary | ICD-10-CM | POA: Diagnosis not present

## 2015-08-08 DIAGNOSIS — Z8739 Personal history of other diseases of the musculoskeletal system and connective tissue: Secondary | ICD-10-CM | POA: Diagnosis not present

## 2015-08-08 DIAGNOSIS — F1110 Opioid abuse, uncomplicated: Secondary | ICD-10-CM | POA: Insufficient documentation

## 2015-08-08 DIAGNOSIS — Z8619 Personal history of other infectious and parasitic diseases: Secondary | ICD-10-CM | POA: Diagnosis not present

## 2015-08-08 DIAGNOSIS — R45851 Suicidal ideations: Secondary | ICD-10-CM | POA: Diagnosis present

## 2015-08-08 DIAGNOSIS — Z9049 Acquired absence of other specified parts of digestive tract: Secondary | ICD-10-CM | POA: Diagnosis not present

## 2015-08-08 DIAGNOSIS — Z79899 Other long term (current) drug therapy: Secondary | ICD-10-CM | POA: Insufficient documentation

## 2015-08-08 DIAGNOSIS — F17200 Nicotine dependence, unspecified, uncomplicated: Secondary | ICD-10-CM | POA: Diagnosis not present

## 2015-08-08 DIAGNOSIS — F3181 Bipolar II disorder: Secondary | ICD-10-CM | POA: Diagnosis not present

## 2015-08-08 DIAGNOSIS — F1510 Other stimulant abuse, uncomplicated: Secondary | ICD-10-CM | POA: Diagnosis not present

## 2015-08-08 DIAGNOSIS — Z7984 Long term (current) use of oral hypoglycemic drugs: Secondary | ICD-10-CM | POA: Diagnosis not present

## 2015-08-08 HISTORY — DX: Post-traumatic stress disorder, unspecified: F43.10

## 2015-08-08 HISTORY — DX: Other psychoactive substance abuse, uncomplicated: F19.10

## 2015-08-08 HISTORY — DX: Type 2 diabetes mellitus without complications: E11.9

## 2015-08-08 HISTORY — DX: Other cervical disc degeneration, unspecified cervical region: M50.30

## 2015-08-08 HISTORY — DX: Unspecified viral hepatitis C without hepatic coma: B19.20

## 2015-08-08 HISTORY — DX: Bipolar II disorder: F31.81

## 2015-08-08 LAB — CBC
HEMATOCRIT: 41.9 % (ref 39.0–52.0)
Hemoglobin: 14.9 g/dL (ref 13.0–17.0)
MCH: 31.3 pg (ref 26.0–34.0)
MCHC: 35.6 g/dL (ref 30.0–36.0)
MCV: 88 fL (ref 78.0–100.0)
Platelets: 349 10*3/uL (ref 150–400)
RBC: 4.76 MIL/uL (ref 4.22–5.81)
RDW: 12.7 % (ref 11.5–15.5)
WBC: 12.5 10*3/uL — ABNORMAL HIGH (ref 4.0–10.5)

## 2015-08-08 LAB — LIPASE, BLOOD: LIPASE: 31 U/L (ref 11–51)

## 2015-08-08 LAB — COMPREHENSIVE METABOLIC PANEL
ALBUMIN: 4.2 g/dL (ref 3.5–5.0)
ALK PHOS: 74 U/L (ref 38–126)
ALT: 106 U/L — AB (ref 17–63)
AST: 83 U/L — AB (ref 15–41)
Anion gap: 10 (ref 5–15)
BILIRUBIN TOTAL: 0.7 mg/dL (ref 0.3–1.2)
BUN: 10 mg/dL (ref 6–20)
CALCIUM: 9.3 mg/dL (ref 8.9–10.3)
CO2: 23 mmol/L (ref 22–32)
Chloride: 106 mmol/L (ref 101–111)
Creatinine, Ser: 0.83 mg/dL (ref 0.61–1.24)
GFR calc Af Amer: >60 mL/min (ref >60)
GLUCOSE: 122 mg/dL — AB (ref 65–99)
Potassium: 3.9 mmol/L (ref 3.5–5.1)
Sodium: 139 mmol/L (ref 135–145)
TOTAL PROTEIN: 7.1 g/dL (ref 6.5–8.1)

## 2015-08-08 LAB — RAPID URINE DRUG SCREEN, HOSP PERFORMED
Amphetamines: POSITIVE — AB
BARBITURATES: NOT DETECTED
Benzodiazepines: NOT DETECTED
COCAINE: NOT DETECTED
Opiates: POSITIVE — AB
Tetrahydrocannabinol: NOT DETECTED

## 2015-08-08 LAB — ACETAMINOPHEN LEVEL: ACETAMINOPHEN (TYLENOL), SERUM: 11 ug/mL (ref 10–30)

## 2015-08-08 LAB — SALICYLATE LEVEL: Salicylate Lvl: <4 mg/dL (ref 2.8–30.0)

## 2015-08-08 LAB — ETHANOL: Alcohol, Ethyl (B): <5 mg/dL (ref <5)

## 2015-08-08 MED ORDER — VITAMIN B-1 100 MG PO TABS
100.0000 mg | ORAL_TABLET | Freq: Every day | ORAL | Status: DC
  Administered 2015-08-08: 100 mg via ORAL
  Filled 2015-08-08: qty 1

## 2015-08-08 MED ORDER — GABAPENTIN 800 MG PO TABS
800.0000 mg | ORAL_TABLET | Freq: Three times a day (TID) | ORAL | Status: DC
  Filled 2015-08-08: qty 1

## 2015-08-08 MED ORDER — METHOCARBAMOL 500 MG PO TABS
500.0000 mg | ORAL_TABLET | Freq: Two times a day (BID) | ORAL | Status: DC | PRN
  Administered 2015-08-08: 500 mg via ORAL
  Filled 2015-08-08: qty 1

## 2015-08-08 MED ORDER — LORAZEPAM 1 MG PO TABS
0.0000 mg | ORAL_TABLET | Freq: Four times a day (QID) | ORAL | Status: DC
  Administered 2015-08-09: 2 mg via ORAL
  Filled 2015-08-08: qty 2

## 2015-08-08 MED ORDER — ESCITALOPRAM OXALATE 10 MG PO TABS
20.0000 mg | ORAL_TABLET | Freq: Every day | ORAL | Status: DC
Start: 2015-08-08 — End: 2015-08-09
  Administered 2015-08-08: 20 mg via ORAL
  Filled 2015-08-08: qty 2

## 2015-08-08 MED ORDER — LORAZEPAM 1 MG PO TABS: 0.0000 mg | ORAL_TABLET | Freq: Two times a day (BID) | ORAL | Status: DC

## 2015-08-08 MED ORDER — BUPRENORPHINE HCL 2 MG SL SUBL
2.0000 mg | SUBLINGUAL_TABLET | Freq: Every day | SUBLINGUAL | Status: DC
  Administered 2015-08-08: 2 mg via SUBLINGUAL
  Filled 2015-08-08: qty 1

## 2015-08-08 MED ORDER — GABAPENTIN 400 MG PO CAPS
800.0000 mg | ORAL_CAPSULE | Freq: Three times a day (TID) | ORAL | Status: DC
  Administered 2015-08-08: 800 mg via ORAL
  Filled 2015-08-08: qty 2

## 2015-08-08 MED ORDER — LORAZEPAM 1 MG PO TABS
1.0000 mg | ORAL_TABLET | Freq: Three times a day (TID) | ORAL | Status: DC | PRN
  Administered 2015-08-08: 1 mg via ORAL
  Filled 2015-08-08: qty 1

## 2015-08-08 MED ORDER — METFORMIN HCL 500 MG PO TABS
1000.0000 mg | ORAL_TABLET | Freq: Two times a day (BID) | ORAL | Status: DC
  Filled 2015-08-08 (×3): qty 2

## 2015-08-08 MED ORDER — THIAMINE HCL 100 MG/ML IJ SOLN: 100.0000 mg | Freq: Every day | INTRAMUSCULAR | Status: DC

## 2015-08-08 MED ORDER — IBUPROFEN 800 MG PO TABS
800.0000 mg | ORAL_TABLET | Freq: Three times a day (TID) | ORAL | Status: DC
  Administered 2015-08-08: 800 mg via ORAL
  Filled 2015-08-08: qty 1

## 2015-08-08 MED ORDER — PANTOPRAZOLE SODIUM 40 MG PO TBEC
40.0000 mg | DELAYED_RELEASE_TABLET | Freq: Every day | ORAL | Status: DC
  Administered 2015-08-08: 40 mg via ORAL
  Filled 2015-08-08: qty 1

## 2015-08-08 NOTE — ED Notes (Signed)
Pt has in belonging bag:  Orange duffle bag, black sandals, green shorts, blue t-shirt, green hat, white charger, black phone, grey electron lighter, blue lighter, silver colored cross necklace, black belt, black wallet ( [place] ID)

## 2015-08-08 NOTE — ED Notes (Signed)
Pt presents SI, plan to run into traffic.  Denies HI, with visual hallucinations, seeing shadows and Paranoia.  Pt reports he relocated from [place] for a better life and things have not worked out for him.  Pt admits to Polysubstance abuse, alcohol, cocaine, Heroin and Opiates(his drug of choice he states.) Pt reports diagnosed with Bipolar DO, Depression, PTSD, Chronic Pain, Attention Deficit DO.  Pt is also a Diabetic.  Pt states he feels hopeless and last attempted SI in 2014, bu cutting himself.  AAO x 3, calm & cooperative, interactive with staff, pleasant.  Monitoring for safety, Q 15 min checks in effect.

## 2015-08-08 NOTE — ED Provider Notes (Signed)
CSN: [id number]     Arrival date & time 08/08/15  1755 History   First MD Initiated Contact with Patient 08/08/15 1951     Chief Complaint  Patient presents with  . Suicidal     HPI  Presents with concern of depression, suicidal thoughts. Patient acknowledges a long history of depression, PTSD, polysubstance abuse. Patient recently moved here from another city.  He states that since moving he has been unable to obtain his medication, and believes that medication does not work to control his depression regardless. Today he says he has particular hopeless, wants it all to end. He states that he has had multiple prior suicide attempts. He also has had multiple prior hospitalizations for substance abuse.     Past Medical History  Diagnosis Date  . Depression   . Substance abuse   . Diabetes mellitus without complication (HCC)   . Hepatitis C   . DDD (degenerative disc disease), cervical   . Bipolar 2 disorder (HCC)   . PTSD (post-traumatic stress disorder)    Past Surgical History  Procedure Laterality Date  . Cholecystectomy     History reviewed. No pertinent family history. Social History  Substance Use Topics  . Smoking status: Current Every Day Smoker  . Smokeless tobacco: None  . Alcohol Use: Yes     Comment: etoh abuse    Review of Systems  Constitutional:       Per HPI, otherwise negative  HENT:       Per HPI, otherwise negative  Respiratory:       Per HPI, otherwise negative  Cardiovascular:       Per HPI, otherwise negative  Gastrointestinal: Positive for abdominal pain. Negative for vomiting.       Chronic upper abd pain  Endocrine:       Negative aside from HPI  Genitourinary:       Neg aside from HPI   Musculoskeletal:       Per HPI, otherwise negative  Skin: Negative.   Neurological: Negative for syncope.  Psychiatric/Behavioral: Positive for suicidal ideas, self-injury, dysphoric mood and decreased concentration. The patient is nervous/anxious.        Allergies  Seroquel; Tylenol; and Morphine and related  Home Medications   Prior to Admission medications   Medication Sig Start Date End Date Taking? Authorizing Provider  buprenorphine-naloxone (SUBOXONE) 8-2 MG SUBL SL tablet Place 1 tablet under the tongue 2 (two) times daily.    Yes Historical Provider, MD  escitalopram (LEXAPRO) 20 MG tablet Take 20 mg by mouth daily.   Yes Historical Provider, MD  gabapentin (NEURONTIN) 800 MG tablet Take 1 tablet by mouth 3 (three) times daily. 05/07/15  Yes Historical Provider, MD  metFORMIN (GLUCOPHAGE) 500 MG tablet Take 1,000 mg by mouth 2 (two) times daily. 05/07/15  Yes Historical Provider, MD  omeprazole (PRILOSEC) 20 MG capsule Take 20 mg by mouth daily.   Yes Historical Provider, MD  ibuprofen (ADVIL,MOTRIN) 800 MG tablet Take 1 tablet (800 mg total) by mouth 3 (three) times daily. Patient not taking: Reported on 08/08/2015 07/09/15   [facility] Ward, PA-C  methocarbamol (ROBAXIN) 500 MG tablet Take 1 tablet (500 mg total) by mouth 2 (two) times daily as needed for muscle spasms. Patient not taking: Reported on 08/08/2015 07/09/15   [facility] Ward, PA-C   BP 130/88 mmHg  Pulse 79  Temp(Src) 98.6 F (37 C) (Oral)  Resp 18  SpO2 99% Physical Exam  Constitutional: He is oriented  to person, place, and time. He appears well-developed. No distress.  HENT:  Head: Normocephalic and atraumatic.  Eyes: Conjunctivae and EOM are normal.  Pulmonary/Chest: Effort normal. No stridor. No respiratory distress.  Abdominal: He exhibits no distension.  Musculoskeletal: He exhibits no edema.  Neurological: He is alert and oriented to person, place, and time.  Skin: Skin is warm and dry.  Psychiatric: His mood appears anxious. Cognition and memory are not impaired. He exhibits a depressed mood.  Nursing note and vitals reviewed.   ED Course  Procedures (including critical care time) Labs Review Labs Reviewed  COMPREHENSIVE METABOLIC PANEL  - Abnormal; Notable for the following:    Glucose, Bld 122 (*)    AST 83 (*)    ALT 106 (*)    All other components within normal limits  CBC - Abnormal; Notable for the following:    WBC 12.5 (*)    All other components within normal limits  URINE RAPID DRUG SCREEN, HOSP PERFORMED - Abnormal; Notable for the following:    Opiates POSITIVE (*)    Amphetamines POSITIVE (*)    All other components within normal limits  ETHANOL  SALICYLATE LEVEL  ACETAMINOPHEN LEVEL  LIPASE, BLOOD      MDM  Patient with long history of depression and polysubstance abuse now presents with concern for suicidal ideation, worsening depression. Here the patient is awake and alert, hemodynamically stable. Labs notable for multiple drugs on urine evaluation, otherwise generally unremarkable. Patient medically cleared for psychiatric evaluation.  Gerhard Munchobert Judit Awad, MD 08/08/15 2248
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
## 2015-08-08 NOTE — ED Notes (Signed)
TTS in room with pt  

## 2015-08-08 NOTE — ED Notes (Signed)
Pt states he [place] [facility] [place] to a rehab house  Pt states he takes medication for depression that is not helping him  Pt states he has been having severe SI for several days  Pt states he fell in April and was given pain medication and since then he has relapsed  Pt states he has been drinking and using heroin, cocaine, amphetamines, and opiates  Pt states he last drank today and last used heroin yesterday and cocaine a few days ago

## 2015-08-09 ENCOUNTER — Inpatient Hospital Stay (HOSPITAL_COMMUNITY)
Admission: EM | Admit: 2015-08-09 | Discharge: 2015-08-14 | DRG: 897 | Disposition: A | Discharge status: Home / Self Care | Payer: Medicare Other | Source: Intra-hospital | Attending: Psychiatry | Admitting: Psychiatry

## 2015-08-09 ENCOUNTER — Encounter (HOSPITAL_COMMUNITY): Payer: Self-pay | Admitting: *Deleted

## 2015-08-09 DIAGNOSIS — E119 Type 2 diabetes mellitus without complications: Secondary | ICD-10-CM | POA: Diagnosis present

## 2015-08-09 DIAGNOSIS — F19239 Other psychoactive substance dependence with withdrawal, unspecified: Secondary | ICD-10-CM | POA: Clinically undetermined

## 2015-08-09 DIAGNOSIS — F3181 Bipolar II disorder: Secondary | ICD-10-CM | POA: Diagnosis present

## 2015-08-09 DIAGNOSIS — F1124 Opioid dependence with opioid-induced mood disorder: Secondary | ICD-10-CM | POA: Diagnosis present

## 2015-08-09 DIAGNOSIS — Z7984 Long term (current) use of oral hypoglycemic drugs: Secondary | ICD-10-CM | POA: Diagnosis not present

## 2015-08-09 DIAGNOSIS — F17210 Nicotine dependence, cigarettes, uncomplicated: Secondary | ICD-10-CM | POA: Diagnosis present

## 2015-08-09 DIAGNOSIS — Z818 Family history of other mental and behavioral disorders: Secondary | ICD-10-CM

## 2015-08-09 DIAGNOSIS — Z62810 Personal history of physical and sexual abuse in childhood: Secondary | ICD-10-CM | POA: Diagnosis present

## 2015-08-09 DIAGNOSIS — F1020 Alcohol dependence, uncomplicated: Secondary | ICD-10-CM | POA: Diagnosis present

## 2015-08-09 DIAGNOSIS — F1120 Opioid dependence, uncomplicated: Secondary | ICD-10-CM | POA: Diagnosis not present

## 2015-08-09 DIAGNOSIS — K219 Gastro-esophageal reflux disease without esophagitis: Secondary | ICD-10-CM | POA: Diagnosis present

## 2015-08-09 DIAGNOSIS — Z9889 Other specified postprocedural states: Secondary | ICD-10-CM

## 2015-08-09 DIAGNOSIS — F4310 Post-traumatic stress disorder, unspecified: Secondary | ICD-10-CM | POA: Diagnosis present

## 2015-08-09 DIAGNOSIS — G4700 Insomnia, unspecified: Secondary | ICD-10-CM | POA: Diagnosis present

## 2015-08-09 DIAGNOSIS — R45851 Suicidal ideations: Secondary | ICD-10-CM | POA: Diagnosis present

## 2015-08-09 DIAGNOSIS — F411 Generalized anxiety disorder: Secondary | ICD-10-CM | POA: Diagnosis present

## 2015-08-09 DIAGNOSIS — G8929 Other chronic pain: Secondary | ICD-10-CM | POA: Diagnosis present

## 2015-08-09 DIAGNOSIS — F1994 Other psychoactive substance use, unspecified with psychoactive substance-induced mood disorder: Secondary | ICD-10-CM | POA: Diagnosis not present

## 2015-08-09 DIAGNOSIS — F419 Anxiety disorder, unspecified: Secondary | ICD-10-CM | POA: Diagnosis not present

## 2015-08-09 DIAGNOSIS — F1924 Other psychoactive substance dependence with psychoactive substance-induced mood disorder: Secondary | ICD-10-CM

## 2015-08-09 LAB — GLUCOSE, CAPILLARY: Glucose-Capillary: 103 mg/dL — ABNORMAL HIGH (ref 65–99)

## 2015-08-09 MED ORDER — PANTOPRAZOLE SODIUM 40 MG PO TBEC
40.0000 mg | DELAYED_RELEASE_TABLET | Freq: Every day | ORAL | Status: DC
  Administered 2015-08-09 – 2015-08-14 (×6): 40 mg via ORAL
  Filled 2015-08-09 (×11): qty 1

## 2015-08-09 MED ORDER — CLONIDINE HCL 0.1 MG PO TABS
0.1000 mg | ORAL_TABLET | Freq: Four times a day (QID) | ORAL | Status: AC
  Administered 2015-08-09 – 2015-08-11 (×8): 0.1 mg via ORAL
  Filled 2015-08-09 (×11): qty 1

## 2015-08-09 MED ORDER — HALOPERIDOL 2 MG PO TABS
2.0000 mg | ORAL_TABLET | Freq: Two times a day (BID) | ORAL | Status: DC
  Administered 2015-08-09 – 2015-08-13 (×7): 2 mg via ORAL
  Filled 2015-08-09 (×12): qty 1

## 2015-08-09 MED ORDER — METHOCARBAMOL 500 MG PO TABS: 500.0000 mg | ORAL_TABLET | Freq: Three times a day (TID) | ORAL | Status: DC | PRN

## 2015-08-09 MED ORDER — HALOPERIDOL 5 MG PO TABS
5.0000 mg | ORAL_TABLET | Freq: Three times a day (TID) | ORAL | Status: DC | PRN
  Administered 2015-08-09 – 2015-08-13 (×3): 5 mg via ORAL
  Filled 2015-08-09 (×3): qty 1

## 2015-08-09 MED ORDER — CLONIDINE HCL 0.1 MG PO TABS
0.1000 mg | ORAL_TABLET | ORAL | Status: AC
  Administered 2015-08-11 – 2015-08-13 (×3): 0.1 mg via ORAL
  Filled 2015-08-09 (×5): qty 1

## 2015-08-09 MED ORDER — METFORMIN HCL 500 MG PO TABS
1000.0000 mg | ORAL_TABLET | Freq: Two times a day (BID) | ORAL | Status: DC
  Administered 2015-08-09 – 2015-08-14 (×10): 1000 mg via ORAL
  Filled 2015-08-09 (×19): qty 2

## 2015-08-09 MED ORDER — LORAZEPAM 2 MG/ML IJ SOLN
1.0000 mg | Freq: Four times a day (QID) | INTRAMUSCULAR | Status: DC | PRN
  Administered 2015-08-12: 1 mg via INTRAMUSCULAR
  Filled 2015-08-09: qty 1

## 2015-08-09 MED ORDER — DIPHENHYDRAMINE HCL 50 MG/ML IJ SOLN
25.0000 mg | Freq: Three times a day (TID) | INTRAMUSCULAR | Status: DC | PRN
  Administered 2015-08-12: 25 mg via INTRAMUSCULAR
  Filled 2015-08-09: qty 1

## 2015-08-09 MED ORDER — ONDANSETRON 4 MG PO TBDP: 4.0000 mg | ORAL_TABLET | Freq: Four times a day (QID) | ORAL | Status: DC | PRN

## 2015-08-09 MED ORDER — DICYCLOMINE HCL 20 MG PO TABS
20.0000 mg | ORAL_TABLET | Freq: Four times a day (QID) | ORAL | Status: AC | PRN
  Administered 2015-08-09 – 2015-08-13 (×4): 20 mg via ORAL
  Filled 2015-08-09 (×4): qty 1

## 2015-08-09 MED ORDER — ONDANSETRON 4 MG PO TBDP: 4.0000 mg | ORAL_TABLET | Freq: Four times a day (QID) | ORAL | Status: AC | PRN

## 2015-08-09 MED ORDER — DICYCLOMINE HCL 20 MG PO TABS: 20.0000 mg | ORAL_TABLET | Freq: Four times a day (QID) | ORAL | Status: DC | PRN

## 2015-08-09 MED ORDER — MAGNESIUM HYDROXIDE 400 MG/5ML PO SUSP: 30.0000 mL | Freq: Every day | ORAL | Status: DC | PRN

## 2015-08-09 MED ORDER — NICOTINE 21 MG/24HR TD PT24
21.0000 mg | MEDICATED_PATCH | Freq: Every day | TRANSDERMAL | Status: DC
  Administered 2015-08-09 – 2015-08-14 (×6): 21 mg via TRANSDERMAL
  Filled 2015-08-09 (×9): qty 1

## 2015-08-09 MED ORDER — ALUM & MAG HYDROXIDE-SIMETH 200-200-20 MG/5ML PO SUSP
30.0000 mL | ORAL | Status: DC | PRN
  Administered 2015-08-11: 30 mL via ORAL
  Filled 2015-08-09: qty 30

## 2015-08-09 MED ORDER — HYDROXYZINE HCL 25 MG PO TABS
25.0000 mg | ORAL_TABLET | Freq: Four times a day (QID) | ORAL | Status: DC | PRN
  Administered 2015-08-09: 25 mg via ORAL
  Filled 2015-08-09: qty 1

## 2015-08-09 MED ORDER — LORAZEPAM 1 MG PO TABS
1.0000 mg | ORAL_TABLET | Freq: Four times a day (QID) | ORAL | Status: DC | PRN
  Administered 2015-08-09 – 2015-08-14 (×8): 1 mg via ORAL
  Filled 2015-08-09 (×9): qty 1

## 2015-08-09 MED ORDER — MAGNESIUM HYDROXIDE 400 MG/5ML PO SUSP: 30.0000 mL | Freq: Four times a day (QID) | ORAL | Status: DC | PRN

## 2015-08-09 MED ORDER — DIPHENHYDRAMINE HCL 25 MG PO CAPS
25.0000 mg | ORAL_CAPSULE | Freq: Three times a day (TID) | ORAL | Status: DC | PRN
  Administered 2015-08-09 – 2015-08-13 (×3): 25 mg via ORAL
  Filled 2015-08-09 (×3): qty 1

## 2015-08-09 MED ORDER — HYDROXYZINE HCL 25 MG PO TABS: 25.0000 mg | ORAL_TABLET | Freq: Four times a day (QID) | ORAL | Status: DC | PRN

## 2015-08-09 MED ORDER — NAPROXEN 500 MG PO TABS: 500.0000 mg | ORAL_TABLET | Freq: Two times a day (BID) | ORAL | Status: DC | PRN

## 2015-08-09 MED ORDER — HYDROXYZINE HCL 25 MG PO TABS
25.0000 mg | ORAL_TABLET | Freq: Four times a day (QID) | ORAL | Status: AC | PRN
  Administered 2015-08-10 – 2015-08-13 (×4): 25 mg via ORAL
  Filled 2015-08-09 (×4): qty 1

## 2015-08-09 MED ORDER — HALOPERIDOL LACTATE 5 MG/ML IJ SOLN
5.0000 mg | Freq: Three times a day (TID) | INTRAMUSCULAR | Status: DC | PRN
  Administered 2015-08-12: 5 mg via INTRAMUSCULAR
  Filled 2015-08-09: qty 1

## 2015-08-09 MED ORDER — ALUM & MAG HYDROXIDE-SIMETH 200-200-20 MG/5ML PO SUSP: 30.0000 mL | Freq: Four times a day (QID) | ORAL | Status: DC | PRN

## 2015-08-09 MED ORDER — METHOCARBAMOL 500 MG PO TABS
500.0000 mg | ORAL_TABLET | Freq: Three times a day (TID) | ORAL | Status: AC | PRN
  Administered 2015-08-09 – 2015-08-12 (×5): 500 mg via ORAL
  Filled 2015-08-09 (×5): qty 1

## 2015-08-09 MED ORDER — BENZTROPINE MESYLATE 0.5 MG PO TABS
0.5000 mg | ORAL_TABLET | Freq: Two times a day (BID) | ORAL | Status: DC
  Administered 2015-08-09 – 2015-08-14 (×10): 0.5 mg via ORAL
  Filled 2015-08-09 (×17): qty 1

## 2015-08-09 MED ORDER — NAPROXEN 500 MG PO TABS
500.0000 mg | ORAL_TABLET | Freq: Two times a day (BID) | ORAL | Status: AC | PRN
  Administered 2015-08-09 – 2015-08-14 (×8): 500 mg via ORAL
  Filled 2015-08-09 (×7): qty 1

## 2015-08-09 MED ORDER — CLONIDINE HCL 0.1 MG PO TABS
0.1000 mg | ORAL_TABLET | Freq: Every day | ORAL | Status: DC
  Administered 2015-08-14: 0.1 mg via ORAL
  Filled 2015-08-09 (×3): qty 1

## 2015-08-09 NOTE — Progress Notes (Signed)
Patient ID: Gary CaoJeremy Boone, male   DOB: 07/30/1970, 45 y.o.   MRN: [id number] PER STATE REGULATIONS 482.30  THIS CHART WAS REVIEWED FOR MEDICAL NECESSITY WITH RESPECT TO THE PATIENT'S ADMISSION/DURATION OF STAY.  NEXT REVIEW DATE:08/13/15  Loura HaltBARBARA Nakai Pollio, RN, BSN CASE MANAGER

## 2015-08-09 NOTE — [facility] Group Notes (Signed)
[facility] LCSW Group Therapy  08/09/2015 3:39 PM  Type of Therapy:  Group Therapy  Participation Level:  Did Not Attend-pt invited. Chose to rest in room.   Summary of Progress/Problems: Today's Topic: Overcoming Obstacles. Patients identified one short term goal and potential obstacles in reaching this goal. Patients processed barriers involved in overcoming these obstacles. Patients identified steps necessary for overcoming these obstacles and explored motivation (internal and external) for facing these difficulties head on.    Smart, Bertrum Helmstetter LCSW 08/09/2015, 3:39 PM

## 2015-08-09 NOTE — Progress Notes (Signed)
Recreation Therapy Notes  Date: 05.12.2017 Time: 9:30am Location: [address] Dayroom   Group Topic: Stress Management  Goal Area(s) Addresses:  Patient will actively participate in stress management techniques presented during session.   Behavioral Response: Did not attend.   Novice Vrba L Towanna Avery, LRT/CTRS        Niki Payment L 08/09/2015 4:12 PM 
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
## 2015-08-09 NOTE — Progress Notes (Signed)
D.  Pt in bed on approach, did not feel well enough to attend evening AA group.  Minimal interaction on unit.  Pt denies SI/HI/hallucinations at this time.  A.  Support and encouragement offered, medication given as ordered  R.  PT remains safe on the unit, will continue to monitor.
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
## 2015-08-09 NOTE — Plan of Care (Signed)
Problem: Consults Goal: Depression Patient Education See Patient Education Module for education specifics.  Outcome: Progressing Nurse discussed depression/coping skills with patient.        

## 2015-08-09 NOTE — Progress Notes (Signed)
D:  Patient's self inventory sheet, patient has poor sleep, sleep medication was not helpful.  Fair appetite, low energy level, poor concentration.  Rated depression, hopeless and anxiety 10.  Withdrawals, feels very sleepy.  Denied SI.  Has been laying in bed resting today, no groups. A:  Medications administered per MD orders.  Emotional support and encouragement given patient. R:  Denied SI and HI this morning, contracts for safety.  Denied A/V hallucinations.  Safety maintained with 15 minute checks.

## 2015-08-09 NOTE — Tx Team (Signed)
Initial Interdisciplinary Treatment Plan   PATIENT STRESSORS: Financial difficulties Health problems Medication change or noncompliance Substance abuse   PATIENT STRENGTHS: Ability for insight Active sense of humor Average or above average intelligence Communication skills Financial means General fund of knowledge Motivation for treatment/growth Religious Affiliation   PROBLEM LIST: Problem List/Patient Goals Date to be addressed Date deferred Reason deferred Estimated date of resolution  "get my medicine straight" 08-09-15     "I was having suicidal thoughts with lexapro" 0512-17     "I had been off my medication" 08-09-15     "I have DDD, arthritis" 08-09-15     NIDDM 08-09-15     Substance abuse 08-09-15                        DISCHARGE CRITERIA:  Ability to meet basic life and health needs Improved stabilization in mood, thinking, and/or behavior Motivation to continue treatment in a less acute level of care Need for constant or close observation no longer present Reduction of life-threatening or endangering symptoms to within safe limits Verbal commitment to aftercare and medication compliance  PRELIMINARY DISCHARGE PLAN: Attend 12-step recovery group Outpatient therapy Return to previous living arrangement  PATIENT/FAMIILY INVOLVEMENT: This treatment plan has been presented to and reviewed with the patient, Barbaraann CaoJeremy Lukins, and/or family member.  The patient and family have been given the opportunity to ask questions and make suggestions.  Mickeal NeedyJohnson, Hagan Maltz N 08/09/2015, 5:07 AM

## 2015-08-09 NOTE — BH Assessment (Signed)
Assessment completed. Consulted Charles Kober, PA-C who recommended inpatient treatment. TTS to seek placement.  

## 2015-08-09 NOTE — ED Provider Notes (Signed)
Patient has been accepted at [facility] by Dr. Dub MikesLugo.   Dione Boozeavid Sufyan Meidinger, MD 08/09/15 [phone]

## 2015-08-09 NOTE — ED Notes (Signed)
Report called to RN Sandra, [facility].  Pending [facility] transport. 

## 2015-08-09 NOTE — B[facility] Counselor (Signed)
CSW attempted to complete assessment today. Pt resting in room. Not able to complete assessment at this time.  Trula SladeHeather Smart, MSW, LCSW Clinical Social Worker 08/09/2015 3:39 PM

## 2015-08-09 NOTE — [facility] Group Notes (Signed)
[facility]   08/09/2015 11:24 AM  Participation Quality:  Invited. DID NOT ATTEND. Pt new to unit. Resting in bed.   Smart, Abdoulie Tierce LCSW
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
## 2015-08-09 NOTE — BH Assessment (Signed)
[facility]Dr. Dub MikesLugo). Pt can be transported at anytime.

## 2015-08-10 LAB — TSH: TSH: 1.693 u[IU]/mL (ref 0.350–4.500)

## 2015-08-10 NOTE — Progress Notes (Signed)
D.  Pt pleasant on approach, but feeling very poorly.  Pt in room eating snack. Pt complaint "I feel like crap".  Pt asked about Subutex and would like to receive this again.  Pt requested Ativan for withdrawal agitation an anxiety.  Pt did not feel well enough to attend evening wrap up group, minimal interaction on unit.  Pt continues to endorse passive SI but denies HI/AVH.  A.  Support and encouragement offered, medications given as ordered  R.  Pt remains safe on unit, will continue to monitor.

## 2015-08-10 NOTE — [facility] Group Notes (Signed)
  08/10/2015 10:00 AM  Type of Therapy and Topic: Group Therapy: Avoiding Self-Sabotaging and Enabling Behaviors  Participation Level: Did Not Attend although encouraged to do so by MHT and group facilitator. Patient reportedly not feeling well on new medication as per RN  Summary of Progress/Problems: The main focus of today's process group was for the patient to identify ways in which they have in the past sabotaged their own recovery. Motivational Interviewing was utilized to identify motivation they may have for wanting to change. The Stages of Change were explained using a handout, and patients identified where they currently are with regard to stages of change.    Carney Bernatherine C Harrill, LCSW

## 2015-08-10 NOTE — Progress Notes (Signed)
Patient ID: Barbaraann CaoJeremy Snedeker, male   DOB: 12/04/1970, 45 y.o.   MRN: [id number] Patient approached following morning group at 11:05 AM and again at 4:20 PM in effort to complete PSA. Patient was sleeping on both occassions; pt reported in morning that he was nauseous due to new medications, unable to awaken patient in afternoon. Roommate reported the patient was 'unwell and needed restl'  Carney BernCatherine C Harrill, LCSW

## 2015-08-10 NOTE — Progress Notes (Signed)
D:  Patient's self inventory sheet, patient sleeps good, sleep medication is helpful.  Fair appetite, low energy level, poor concentration.  Rated depression 5, hopeless 7, anxiety 10.  Withdrawals, tremors, sedation, chilling, cravings.  Denied SI, contracts for safety.  Has felt lightheaded, dizzy in past 24 hours.  Denied physical pain.  Goal is to feel better.  Plans to stay in bed and rest.  [facility]. A:  Medications administered per MD orders.  Emotional support and encouragement given patient. R:  Denied SI on self inventory form.  While patient was talking to nurse this morning, patient admitted SI thoughts, contracts for safety.  Denied A/V hallucinations.  Safety maintained with 15 minute checks.

## 2015-08-10 NOTE — Plan of Care (Signed)
Problem: Alteration in mood Goal: LTG-Patient reports reduction in suicidal thoughts (Patient reports reduction in suicidal thoughts and is able to verbalize a safety plan for whenever patient is feeling suicidal)  Outcome: Progressing Nurse discussed SI thoughts/coping skills with patient.

## 2015-08-10 NOTE — [facility] Group Notes (Signed)
[facility] Group Notes:  (Nursing/MHT/Case Management/Adjunct)  Date:  08/10/2015  Time:  0900  Type of Therapy:  Nurse Education  Participation Level:  Did Not Attend  Summary of Progress/Problems:  Pt was invited but did not attend.   Maurine SimmeringShugart, Jquan Egelston M 08/10/2015, 10:41 AM

## 2015-08-10 NOTE — Progress Notes (Signed)
[facility]  08/10/2015 1:49 PM Barbaraann CaoJeremy Shetterly  MRN:  [id number] Subjective:"Im horrible, Been using heroin , cocaine, meth, and whatever I can get my hands on. Been using for about 29 years. I feel bad, these muscles and tremors are terrible. Yesterday I decided that I don't want to live like this anymore." Patient seen by this NP today, case discussed with social worker and nursing. As per nurse no acute problem, tolerating medications without any side effect. Multiple somatic complaints. Patient evaluated and case reviewed 08/10/2015.  Pt is alert and oriented x4, calm and cooperative during the evaluation yet asleep. During evaluation patient reported having a bad day yesterday adjusting to the unit and, tolerating dose of medication well last night.He denies suicidal/homicidal ideation, endorse minimal auditory/visual hallucination. He notes increased anxiety and depression/feeling sad. He last used yesterday. Denies any side effects from the medications at this time. He is unable to tolerate breakfast although he denies GI symptoms. He endorses better night's sleep last night, poor appetite, no acute pain. He is unable to attend and participate in group mileu reporting he feels bad. No suicidal ideation or self-harm, or psychosis. 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 Opioid use disorder, severe, dependence (HCC) [F11.20] 08/09/2015  . 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 History of spinal surgery [Z98.890] 08/09/2015   Total Time spent with patient: 20 minutes  Past Psychiatric History: Depression, Opioid use disorder  Past Medical History:  Past Medical History   Diagnosis Date  . Depression   . Substance abuse   . Diabetes mellitus without complication (HCC)   . Hepatitis C   . DDD (degenerative disc disease), cervical   . Bipolar 2 disorder (HCC)   . PTSD (post-traumatic stress disorder)     Past Surgical History  Procedure Laterality Date  . Cholecystectomy     Family History:  Family History  Problem Relation Age of Onset  . Drug abuse Father   . Schizophrenia Paternal Aunt    Family Psychiatric  History: SeeHPI Social History:  History  Alcohol Use  . Yes    Comment: etoh abuse     History  Drug Use  . Yes  . Special: Marijuana, Cocaine, Methamphetamines    Comment: heroin, opiates    Social History   Social History  . Marital Status: Single    Spouse Name: N/A  . Number of Children: N/A  . Years of Education: N/A   Social History Main Topics  . Smoking status: Current Every Day Smoker -- 2.50 packs/day    Types: Cigarettes  . Smokeless tobacco: None  . Alcohol Use: Yes     Comment: etoh abuse  . Drug Use: Yes    Special: Marijuana, Cocaine, Methamphetamines     Comment: heroin, opiates  . Sexual Activity: Yes   Other Topics Concern  . None   Social History Narrative   Additional Social History:    Sleep: Good  Appetite:  Poor  Current Medications: Current Facility-Administered Medications  Medication Dose Route Frequency Provider Last Rate Last Dose  . alum & mag hydroxide-simeth (MAALOX/MYLANTA) 200-200-20 MG/5ML suspension 30 mL  30 mL Oral Q4H PRN Sanjuana KavaAgnes I Nwoko, NP      .  benztropine (COGENTIN) tablet 0.5 mg  0.5 mg Oral BID Jomarie Longs, MD   0.5 mg at 08/10/15 0833  . cloNIDine (CATAPRES) tablet 0.1 mg  0.1 mg Oral QID Sanjuana Kava, NP   0.1 mg at 08/10/15 1210   Followed by  . [START ON 08/11/2015] cloNIDine (CATAPRES) tablet 0.1 mg  0.1 mg Oral BH-qamhs Sanjuana Kava, NP       Followed by  . [START ON 08/14/2015] cloNIDine (CATAPRES) tablet 0.1 mg  0.1 mg Oral QAC breakfast Sanjuana Kava,  NP      . dicyclomine (BENTYL) tablet 20 mg  20 mg Oral Q6H PRN Sanjuana Kava, NP   20 mg at 08/09/15 1249  . diphenhydrAMINE (BENADRYL) capsule 25 mg  25 mg Oral Q8H PRN Jomarie Longs, MD   25 mg at 08/09/15 2143   Or  . diphenhydrAMINE (BENADRYL) injection 25 mg  25 mg Intramuscular Q8H PRN Saramma Eappen, MD      . haloperidol (HALDOL) tablet 2 mg  2 mg Oral BID Jomarie Longs, MD   2 mg at 08/10/15 0834  . haloperidol (HALDOL) tablet 5 mg  5 mg Oral Q8H PRN Jomarie Longs, MD   5 mg at 08/09/15 2143   Or  . haloperidol lactate (HALDOL) injection 5 mg  5 mg Intramuscular Q8H PRN Jomarie Longs, MD      . hydrOXYzine (ATARAX/VISTARIL) tablet 25 mg  25 mg Oral Q6H PRN Sanjuana Kava, NP      . LORazepam (ATIVAN) tablet 1 mg  1 mg Oral Q6H PRN Jomarie Longs, MD   1 mg at 08/09/15 2142   Or  . LORazepam (ATIVAN) injection 1 mg  1 mg Intramuscular Q6H PRN Saramma Eappen, MD      . magnesium hydroxide (MILK OF MAGNESIA) suspension 30 mL  30 mL Oral Daily PRN Sanjuana Kava, NP      . metFORMIN (GLUCOPHAGE) tablet 1,000 mg  1,000 mg Oral BID WC Sanjuana Kava, NP   1,000 mg at 08/10/15 0834  . methocarbamol (ROBAXIN) tablet 500 mg  500 mg Oral Q8H PRN Sanjuana Kava, NP   500 mg at 08/09/15 2142  . naproxen (NAPROSYN) tablet 500 mg  500 mg Oral BID PRN Sanjuana Kava, NP   500 mg at 08/09/15 1251  . nicotine (NICODERM CQ - dosed in mg/24 hours) patch 21 mg  21 mg Transdermal Daily Court Joy, PA-C   21 mg at 08/10/15 0835  . ondansetron (ZOFRAN-ODT) disintegrating tablet 4 mg  4 mg Oral Q6H PRN Sanjuana Kava, NP      . pantoprazole (PROTONIX) EC tablet 40 mg  40 mg Oral Daily Sanjuana Kava, NP   40 mg at 08/10/15 1610    Lab Results:  Results for orders placed or performed during the hospital encounter of 08/09/15 (from the past 48 hour(s))  Glucose, capillary     Status: Abnormal   Collection Time: 08/09/15  6:15 AM  Result Value Ref Range   Glucose-Capillary 103 (H) 65 - 99 mg/dL     Blood Alcohol level:  Lab Results  Component Value Date   ETH <5 08/08/2015   ETH 127* 05/21/2015    Physical Findings: AIMS: Facial and Oral Movements Muscles of Facial Expression: None, normal Lips and Perioral Area: None, normal Jaw: None, normal Tongue: None, normal,Extremity Movements Upper (arms, wrists, hands, fingers): None, normal Lower (legs, knees, ankles, toes): None, normal, Trunk Movements Neck,  shoulders, hips: None, normal, Overall Severity Severity of abnormal movements (highest score from questions above): None, normal Incapacitation due to abnormal movements: None, normal Patient's awareness of abnormal movements (rate only patient's report): No Awareness, Dental Status Current problems with teeth and/or dentures?: Yes Does patient usually wear dentures?: No  CIWA:  CIWA-Ar Total: 5 COWS:  COWS Total Score: 4  Musculoskeletal: Strength & Muscle Tone: within normal limits Gait & Station: normal Patient leans: N/A  Psychiatric Specialty Exam: Review of Systems  Psychiatric/Behavioral: Positive for depression and substance abuse. Negative for suicidal ideas, hallucinations and memory loss. The patient is nervous/anxious. The patient does not have insomnia.   All other systems reviewed and are negative.   Blood pressure 100/55, pulse 57, temperature 98.5 F (36.9 C), temperature source Oral, resp. rate 18, height 6' (1.829 m), weight 108.863 kg (240 lb).Body mass index is 32.54 kg/(m^2).  General Appearance: Disheveled  Eye Contact::  Minimal  Speech:  Garbled and Slow  Volume:  Decreased  Mood:  Anxious, Depressed and Hopeless  Affect:  Blunt, Depressed and Flat  Thought Process:  Goal Directed and Linear  Orientation:  Full (Time, Place, and Person)  Thought Content:  Hallucinations: Auditory  Suicidal Thoughts:  No  Homicidal Thoughts:  No  Memory:  Immediate;   Fair Recent;   Fair Remote;   Poor  Judgement:  Fair  Insight:  Lacking   Psychomotor Activity:  Decreased  Concentration:  Poor  Recall:  Poor  Fund of Knowledge:Fair  Language: Fair  Akathisia:  No  Handed:  Right  AIMS (if indicated):     Assets:  Communication Skills Desire for Improvement Financial Resources/Insurance Housing Leisure Time Physical Health Social Support Transportation Vocational/Educational  ADL's:  Intact  Cognition: WNL  Sleep:  Number of Hours: 6.25   Treatment Plan Summary: Daily contact with patient to assess and evaluate symptoms and progress in treatment and Medication management Medication management to reduce current symptoms to base line and improve the patient's overall level of functioning; Clonidine detox protocols for opioid detox, Haldol 2 mg for agitation/mood control, Cogentin 0.5 mg for EPS, resume Metformin 1,000 mg for diabetes management, Protonix EC 40 mg for acid reflux. 3. Treat health problems as indicated.  4. Develop treatment plan to decrease risk of relapse upon discharge and the need for readmission.  5. Psycho-social education regarding relapse prevention and self care.  6. Health care follow up as needed for medical problems.  7. Review, reconcile, and reinstate any pertinent home medications for other health issues where appropriate. 8. Call for consults with hospitalist for any additional specialty patient care services as needed Truman Hayward, FNP 08/10/2015, 1:49 PM I agreed with findings and treatment plan of this patient

## 2015-08-11 LAB — LIPID PANEL
CHOL/HDL RATIO: 5.4 ratio
Cholesterol: 140 mg/dL (ref 0–200)
HDL: 26 mg/dL — AB (ref >40)
LDL CALC: 68 mg/dL (ref 0–99)
Triglycerides: 230 mg/dL — ABNORMAL HIGH (ref <150)
VLDL: 46 mg/dL — AB (ref 0–40)
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
## 2015-08-11 NOTE — Progress Notes (Addendum)
NP informed when BP 148/134 P73, 100% RA, 97.8 oral, R18.  Nurse gave patient ativan 1 mg po and naproxen 500 mg for back pain #10.  Respirations even and unlabored.  No signs/symptoms of pain/distress noted on patient's face/body movements.  Safety maintained with 15 minute checks. Charge [facility] and [facility] informed. Patient continues to ask for suboxon, stated he is not coming back here again if he can't get suboxon.

## 2015-08-11 NOTE — [facility] Group Notes (Signed)
[facility] LCSW Group Therapy  08/11/2015 10:00 AM  Type of Therapy:  Group Therapy  Participation Level:  Did Not Attend   Gary Boone, Gary Boone 08/11/2015, 3:38 PM

## 2015-08-11 NOTE — Progress Notes (Signed)
D:  Patient's self inventory sheet, patient sleeps good, no sleep medication given.  Rated depression, anxiety, hopeless #10  Withdrawals tremors, sedation, agitation, irritability.  Denied SI.  "Feel like crap."  Goal is to sleep, rest. A:  Medications administered per MD order.  Emotional support and encouragement given patient. R:  Denied SI and HI, contracts for safety.  Denied A/V hallucinations.  Safety maintained with 15 minute checks.

## 2015-08-11 NOTE — Progress Notes (Signed)
D.  Pt pleasant but "feeling crappy" on approach.  Pt normally takes Subutex and has not been able to get this here.  Pt did attend part of AA group, but did leave early due to feeling unwell.  Pt has signed a 72 hour request for discharge with the main reason being that he wants to get back on the Subutex.  Pt denies SI/HI/hallucinations at this time,and is interacting appropriately with peers on unit.  A.  Support and encouragement offered, medication given as ordered  R.  Pt remains safe on the unit, will continue to monitor.

## 2015-08-11 NOTE — Progress Notes (Signed)
Patient did not attend AA group meeting, is sleeping. 

## 2015-08-11 NOTE — Plan of Care (Signed)
Problem: Alteration in mood & ability to function due to Goal: LTG-Pt reports reduction in suicidal thoughts (Patient reports reduction in suicidal thoughts and is able to verbalize a safety plan for whenever patient is feeling suicidal)  Outcome: Progressing Pt has denied suicidal ideation

## 2015-08-11 NOTE — [facility] Counselor (Signed)
Patient was in LCSW group at 10:15 thus unable to complete PSA; before leaving adult hall patient was approached in his room to complete PSA. Patient reported he did not feel well and covered his head with bed linens refusing to talk.  Will approach patient again closer to lunch time.   Carney Bernatherine C Floria Brandau, LCSW

## 2015-08-11 NOTE — Progress Notes (Signed)
Patient upset because he has not been given suboxone for detox.  Will discuss his medications with MD Monday 08/12/2015 morning.

## 2015-08-11 NOTE — Progress Notes (Signed)
PATIENT SIGNED 72 HR REQUEST FOR DISCHARGE ON 08/11/2015 AT 1805.

## 2015-08-11 NOTE — Plan of Care (Signed)
Problem: Consults Goal: Substance Abuse Patient Education See Patient Education Module for education specifics.  Outcome: Progressing Nurse discussed substance abuse/depression/coping skills with patient.     

## 2015-08-11 NOTE — Progress Notes (Signed)
Patient ID: Gary Boone, male   DOB: 01-Nov-1970, 45 y.o.   MRN: [id number] [facility] MD Progress Note  08/11/2015 12:51 PM Gary Boone  MRN:  [id number]  Subjective: Gary Boone reports, "I still feel very sick from withdrawal symptoms. I have always used Suboxone to help me my opioid addiction. You guys refused to give it to me here. I feel depressed, barely eating"  Patient seen by this NP today, case discussed with social worker and nursing. As per nurse no acute problem, tolerating medications without any side effect. Multiple somatic complaints/withdrawal symptoms of opioid.  Pt is alert and oriented x4, calm and cooperative during the evaluation, yet asleep. Denies any side effects from the medications at this time. He states he barely tolerated breakfast although he denies GI symptoms. He is unable to attend and participate in group mileu reporting because he feels bad. No suicidal ideation or self-harm, or psychosis. He is complaint with medications reporting they are well tolerated and denying any adverse events. He is still adamant about being put on Suboxen therapy.  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 Opioid use disorder, severe, dependence (HCC) [F11.20] 08/09/2015  . 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 History of spinal surgery [Z98.890] 08/09/2015   Total Time spent with patient: 20 minutes  Past Psychiatric History: Depression, Opioid use disorder  Past Medical History:  Past Medical History  Diagnosis Date  . Depression   . Substance abuse   . Diabetes mellitus without complication (HCC)   . Hepatitis C   . DDD (degenerative disc disease), cervical   . Bipolar 2 disorder (HCC)   . PTSD (post-traumatic stress disorder)     Past Surgical History   Procedure Laterality Date  . Cholecystectomy     Family History:  Family History  Problem Relation Age of Onset  . Drug abuse Father   . Schizophrenia Paternal Aunt    Family Psychiatric  History: SeeHPI Social History:  History  Alcohol Use  . Yes    Comment: etoh abuse     History  Drug Use  . Yes  . Special: Marijuana, Cocaine, Methamphetamines    Comment: heroin, opiates    Social History   Social History  . Marital Status: Single    Spouse Name: N/A  . Number of Children: N/A  . Years of Education: N/A   Social History Main Topics  . Smoking status: Current Every Day Smoker -- 2.50 packs/day    Types: Cigarettes  . Smokeless tobacco: None  . Alcohol Use: Yes     Comment: etoh abuse  . Drug Use: Yes    Special: Marijuana, Cocaine, Methamphetamines     Comment: heroin, opiates  . Sexual Activity: Yes   Other Topics Concern  . None   Social History Narrative   Additional Social History:  Sleep: Good  Appetite:  Poor  Current Medications: Current Facility-Administered Medications  Medication Dose Route Frequency Provider Last Rate Last Dose  . alum & mag hydroxide-simeth (MAALOX/MYLANTA) 200-200-20 MG/5ML suspension 30 mL  30 mL Oral Q4H PRN Sanjuana Kava, NP      . benztropine (COGENTIN) tablet 0.5 mg  0.5 mg Oral BID Jomarie Longs, MD   0.5 mg at 08/11/15 0814  . cloNIDine (CATAPRES) tablet 0.1 mg  0.1 mg Oral BH-qamhs Sanjuana Kava,  NP       Followed by  . [START ON 08/14/2015] cloNIDine (CATAPRES) tablet 0.1 mg  0.1 mg Oral QAC breakfast Sanjuana KavaAgnes I Nwoko, NP      . dicyclomine (BENTYL) tablet 20 mg  20 mg Oral Q6H PRN Sanjuana KavaAgnes I Nwoko, NP   20 mg at 08/10/15 2114  . diphenhydrAMINE (BENADRYL) capsule 25 mg  25 mg Oral Q8H PRN Jomarie LongsSaramma Eappen, MD   25 mg at 08/09/15 2143   Or  . diphenhydrAMINE (BENADRYL) injection 25 mg  25 mg Intramuscular Q8H PRN Saramma Eappen, MD      . haloperidol (HALDOL) tablet 2 mg  2 mg Oral BID Jomarie LongsSaramma Eappen, MD   2 mg at  08/11/15 0814  . haloperidol (HALDOL) tablet 5 mg  5 mg Oral Q8H PRN Jomarie LongsSaramma Eappen, MD   5 mg at 08/09/15 2143   Or  . haloperidol lactate (HALDOL) injection 5 mg  5 mg Intramuscular Q8H PRN Jomarie LongsSaramma Eappen, MD      . hydrOXYzine (ATARAX/VISTARIL) tablet 25 mg  25 mg Oral Q6H PRN Sanjuana KavaAgnes I Nwoko, NP   25 mg at 08/10/15 2344  . LORazepam (ATIVAN) tablet 1 mg  1 mg Oral Q6H PRN Jomarie LongsSaramma Eappen, MD   1 mg at 08/10/15 2119   Or  . LORazepam (ATIVAN) injection 1 mg  1 mg Intramuscular Q6H PRN Saramma Eappen, MD      . magnesium hydroxide (MILK OF MAGNESIA) suspension 30 mL  30 mL Oral Daily PRN Sanjuana KavaAgnes I Nwoko, NP      . metFORMIN (GLUCOPHAGE) tablet 1,000 mg  1,000 mg Oral BID WC Sanjuana KavaAgnes I Nwoko, NP   1,000 mg at 08/11/15 0814  . methocarbamol (ROBAXIN) tablet 500 mg  500 mg Oral Q8H PRN Sanjuana KavaAgnes I Nwoko, NP   500 mg at 08/10/15 2114  . naproxen (NAPROSYN) tablet 500 mg  500 mg Oral BID PRN Sanjuana KavaAgnes I Nwoko, NP   500 mg at 08/10/15 2344  . nicotine (NICODERM CQ - dosed in mg/24 hours) patch 21 mg  21 mg Transdermal Daily Court Joyharles E Kober, PA-C   21 mg at 08/11/15 0813  . ondansetron (ZOFRAN-ODT) disintegrating tablet 4 mg  4 mg Oral Q6H PRN Sanjuana KavaAgnes I Nwoko, NP      . pantoprazole (PROTONIX) EC tablet 40 mg  40 mg Oral Daily Sanjuana KavaAgnes I Nwoko, NP   40 mg at 08/11/15 16100814   Lab Results:  Results for orders placed or performed during the hospital encounter of 08/09/15 (from the past 48 hour(s))  Lipid panel, fasting     Status: Abnormal   Collection Time: 08/10/15  6:24 PM  Result Value Ref Range   Cholesterol 140 0 - 200 mg/dL   Triglycerides 960230 (H) <150 mg/dL   HDL 26 (L) >45>40 mg/dL   Total CHOL/HDL Ratio 5.4 RATIO   VLDL 46 (H) 0 - 40 mg/dL   LDL Cholesterol 68 0 - 99 mg/dL    Comment:        Total Cholesterol/HDL:CHD Risk Coronary Heart Disease Risk Table                     Men   Women  1/2 Average Risk   3.4   3.3  Average Risk       5.0   4.4  2 X Average Risk   9.6   7.1  3 X Average Risk  23.4    11.0  Use the calculated Patient Ratio above and the CHD Risk Table to determine the patient's CHD Risk.        ATP III CLASSIFICATION (LDL):  <100     mg/dL   Optimal  161-096  mg/dL   Near or Above                    Optimal  130-159  mg/dL   Borderline  045-409  mg/dL   High  >811     mg/dL   Very High Performed at [facility]   TSH     Status: None   Collection Time: 08/10/15  6:24 PM  Result Value Ref Range   TSH 1.693 0.350 - 4.500 uIU/mL    Comment: Performed at [facility]   Blood Alcohol level:  Lab Results  Component Value Date   [facility] <5 08/08/2015   ETH 127* 05/21/2015   Physical Findings: AIMS: Facial and Oral Movements Muscles of Facial Expression: None, normal Lips and Perioral Area: None, normal Jaw: None, normal Tongue: None, normal,Extremity Movements Upper (arms, wrists, hands, fingers): None, normal Lower (legs, knees, ankles, toes): None, normal, Trunk Movements Neck, shoulders, hips: None, normal, Overall Severity Severity of abnormal movements (highest score from questions above): None, normal Incapacitation due to abnormal movements: None, normal Patient's awareness of abnormal movements (rate only patient's report): No Awareness, Dental Status Current problems with teeth and/or dentures?: Yes Does patient usually wear dentures?: No  CIWA:  CIWA-Ar Total: 7 COWS:  COWS Total Score: 8  Musculoskeletal: Strength & Muscle Tone: within normal limits Gait & Station: normal Patient leans: N/A  Psychiatric Specialty Exam: Review of Systems  Constitutional: Positive for chills, malaise/fatigue and diaphoresis.  HENT: Negative.   Eyes: Negative.   Respiratory: Negative.   Cardiovascular: Negative.   Gastrointestinal: Positive for nausea.  Genitourinary: Negative.   Musculoskeletal: Positive for myalgias and joint pain.  Skin: Negative.   Neurological: Positive for dizziness and weakness.  Endo/Heme/Allergies:  Negative.   Psychiatric/Behavioral: Positive for depression and substance abuse. Negative for suicidal ideas, hallucinations and memory loss. The patient is nervous/anxious and has insomnia.   All other systems reviewed and are negative.   Blood pressure 91/66, pulse 83, temperature 97.9 F (36.6 C), temperature source Oral, resp. rate 15, height 6' (1.829 m), weight 108.863 kg (240 lb).Body mass index is 32.54 kg/(m^2).  General Appearance: Disheveled  Eye Contact::  Minimal  Speech:  Garbled and Slow  Volume:  Decreased  Mood:  Anxious, Depressed and Hopeless  Affect:  Blunt, Depressed and Flat  Thought Process:  Goal Directed and Linear  Orientation:  Full (Time, Place, and Person)  Thought Content:  Hallucinations: Auditory  Suicidal Thoughts:  No  Homicidal Thoughts:  No  Memory:  Immediate;   Fair Recent;   Fair Remote;   Poor  Judgement:  Fair  Insight:  Lacking  Psychomotor Activity:  Decreased  Concentration:  Poor  Recall:  Poor  Fund of Knowledge:Fair  Language: Fair  Akathisia:  No  Handed:  Right  AIMS (if indicated):     Assets:  Communication Skills Desire for Improvement Financial Resources/Insurance Housing Leisure Time Physical Health Social Support Transportation Vocational/Educational  ADL's:  Intact  Cognition: WNL  Sleep:  Number of Hours: 6.25   Treatment Plan Summary: Daily contact with patient to assess and evaluate symptoms and progress in treatment and Medication management: Medication management to reduce current symptoms to base line and improve the patient's  overall level of functioning; Clonidine detox protocols for opioid detox, Haldol 2 mg for agitation/mood control, Cogentin 0.5 mg for EPS, resume Metformin 1,000 mg for diabetes management, Protonix EC 40 mg for acid reflux. 3. Treat health problems as indicated.  4. Develop treatment plan to decrease risk of relapse upon discharge and the need for readmission.  5. Psycho-social  education regarding relapse prevention and self care.  6. Health care follow up as needed for medical problems.  7. Review, reconcile, and reinstate any pertinent home medications for other health issues where appropriate. 8. Call for consults with hospitalist for any additional specialty patient care services as needed.  Sanjuana Kava, NP, PMHNP-BC 08/11/2015, 12:51 PM I agreed with findings and treatment plan of this patient

## 2015-08-12 NOTE — Progress Notes (Signed)
Riki RuskJeremy is expressing his anxiety as soon as the shift starts. Rates Anxiety 10/10 and Depression 5/10. He did not have any goals for today. He states his coping skills for his anxiety include attempting to call his sponsor, prayer, deep breathing and keeping to himself.  Denies SI/HI/AVH. Encouragement and support given. Medications administered as prescribed. Continue Q 15 minute checks for patient safety and medication effectiveness.

## 2015-08-12 NOTE — Progress Notes (Signed)
D-  Patient is complaining of symptoms of withdrawal but will not take any medications to help decrease symptoms.  Patient stated "only suboxone will help but they won't give it to me here."  Patient is irritable and agitated this shift.    Patient denies SI, HI, and AVH.   A- Assess patient for safety, offer medications as prescribed, engage patient in 1:1 staff talks.   R-  Patient able to contract for safety, continue to monitor.

## 2015-08-12 NOTE — Progress Notes (Signed)
Patient ID: Gary Boone, male   DOB: May 16, 1970, 45 y.o.   MRN: [id number] [facility] MD Progress Note  08/12/2015 10:50 AM Gary Boone  MRN:  [id number] Subjective: The 45 year old Caucasian male with long history of opioid abuse, probable bipolar disorder. Patient was seen this morning and states that he would like to leave and that for the Suboxone clinic. States that he does not feel great and has been resting. Denies any side effects from the medications at this time. States he did not have any breakfast this morning. However he reports that he ate some cereal bars. Reports he slept decent last night. He has not been attending groups or participating in any of the milieu activities. No suicidal ideation or self-harm, or psychosis. 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) Diagnosis:   Patient Active Problem List   Diagnosis Date Noted  . Opioid use disorder, severe, dependence (HCC) [F11.20] 08/09/2015  . 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 History of spinal surgery [Z98.890] 08/09/2015   Total Time spent with patient: 20 minutes  Past Psychiatric History: Depression, Opioid use disorder  Past Medical History:  Past Medical History  Diagnosis Date  . Depression   . Substance abuse   . Diabetes mellitus without complication (HCC)   . Hepatitis C   . DDD (degenerative disc disease), cervical   . Bipolar 2 disorder (HCC)   . PTSD (post-traumatic stress disorder)     Past Surgical History  Procedure Laterality Date  . Cholecystectomy     Family History:  Family History  Problem Relation Age of Onset  . Drug abuse Father   . Schizophrenia Paternal Aunt    Family Psychiatric  History: SeeHPI Social History:  History  Alcohol Use   . Yes    Comment: etoh abuse     History  Drug Use  . Yes  . Special: Marijuana, Cocaine, Methamphetamines    Comment: heroin, opiates    Social History   Social History  . Marital Status: Single    Spouse Name: N/A  . Number of Children: N/A  . Years of Education: N/A   Social History Main Topics  . Smoking status: Current Every Day Smoker -- 2.50 packs/day    Types: Cigarettes  . Smokeless tobacco: None  . Alcohol Use: Yes     Comment: etoh abuse  . Drug Use: Yes    Special: Marijuana, Cocaine, Methamphetamines     Comment: heroin, opiates  . Sexual Activity: Yes   Other Topics Concern  . None   Social History Narrative   Additional Social History:    Sleep: Good  Appetite:  Poor  Current Medications: Current Facility-Administered Medications  Medication Dose Route Frequency Provider Last Rate Last Dose  . alum & mag hydroxide-simeth (MAALOX/MYLANTA) 200-200-20 MG/5ML suspension 30 mL  30 mL Oral Q4H PRN Sanjuana Kava, NP   30 mL at 08/11/15 2056  . benztropine (COGENTIN) tablet 0.5 mg  0.5 mg Oral BID Jomarie Longs, MD   0.5 mg at 08/12/15 0922  . cloNIDine (CATAPRES) tablet 0.1 mg  0.1 mg Oral BH-qamhs Sanjuana Kava, NP   0.1 mg at 08/11/15 2057   Followed by  . [START ON 08/14/2015] cloNIDine (CATAPRES) tablet 0.1 mg  0.1 mg Oral QAC breakfast Sanjuana Kava, NP      .  dicyclomine (BENTYL) tablet 20 mg  20 mg Oral Q6H PRN Sanjuana Kava, NP   20 mg at 08/11/15 2058  . diphenhydrAMINE (BENADRYL) capsule 25 mg  25 mg Oral Q8H PRN Jomarie Longs, MD   25 mg at 08/11/15 2057   Or  . diphenhydrAMINE (BENADRYL) injection 25 mg  25 mg Intramuscular Q8H PRN Saramma Eappen, MD      . haloperidol (HALDOL) tablet 2 mg  2 mg Oral BID Jomarie Longs, MD   2 mg at 08/12/15 0921  . haloperidol (HALDOL) tablet 5 mg  5 mg Oral Q8H PRN Jomarie Longs, MD   5 mg at 08/11/15 1805   Or  . haloperidol lactate (HALDOL) injection 5 mg  5 mg Intramuscular Q8H PRN Jomarie Longs, MD       . hydrOXYzine (ATARAX/VISTARIL) tablet 25 mg  25 mg Oral Q6H PRN Sanjuana Kava, NP   25 mg at 08/11/15 2057  . LORazepam (ATIVAN) tablet 1 mg  1 mg Oral Q6H PRN Jomarie Longs, MD   1 mg at 08/11/15 2057   Or  . LORazepam (ATIVAN) injection 1 mg  1 mg Intramuscular Q6H PRN Saramma Eappen, MD      . magnesium hydroxide (MILK OF MAGNESIA) suspension 30 mL  30 mL Oral Daily PRN Sanjuana Kava, NP      . metFORMIN (GLUCOPHAGE) tablet 1,000 mg  1,000 mg Oral BID WC Sanjuana Kava, NP   1,000 mg at 08/12/15 0920  . methocarbamol (ROBAXIN) tablet 500 mg  500 mg Oral Q8H PRN Sanjuana Kava, NP   500 mg at 08/11/15 1804  . naproxen (NAPROSYN) tablet 500 mg  500 mg Oral BID PRN Sanjuana Kava, NP   500 mg at 08/11/15 2057  . nicotine (NICODERM CQ - dosed in mg/24 hours) patch 21 mg  21 mg Transdermal Daily Court Joy, PA-C   21 mg at 08/12/15 1610  . ondansetron (ZOFRAN-ODT) disintegrating tablet 4 mg  4 mg Oral Q6H PRN Sanjuana Kava, NP      . pantoprazole (PROTONIX) EC tablet 40 mg  40 mg Oral Daily Sanjuana Kava, NP   40 mg at 08/12/15 9604    Lab Results:  Results for orders placed or performed during the hospital encounter of 08/09/15 (from the past 48 hour(s))  Lipid panel, fasting     Status: Abnormal   Collection Time: 08/10/15  6:24 PM  Result Value Ref Range   Cholesterol 140 0 - 200 mg/dL   Triglycerides 540 (H) <150 mg/dL   HDL 26 (L) >98 mg/dL   Total CHOL/HDL Ratio 5.4 RATIO   VLDL 46 (H) 0 - 40 mg/dL   LDL Cholesterol 68 0 - 99 mg/dL    Comment:        Total Cholesterol/HDL:CHD Risk Coronary Heart Disease Risk Table                     Men   Women  1/2 Average Risk   3.4   3.3  Average Risk       5.0   4.4  2 X Average Risk   9.6   7.1  3 X Average Risk  23.4   11.0        Use the calculated Patient Ratio above and the CHD Risk Table to determine the patient's CHD Risk.        ATP III CLASSIFICATION (LDL):  <100  mg/dL   Optimal  161-096  mg/dL   Near or Above                     Optimal  130-159  mg/dL   Borderline  045-409  mg/dL   High  >811     mg/dL   Very High Performed at [facility]   TSH     Status: None   Collection Time: 08/10/15  6:24 PM  Result Value Ref Range   TSH 1.693 0.350 - 4.500 uIU/mL    Comment: Performed at [facility]    Blood Alcohol level:  Lab Results  Component Value Date   [facility] <5 08/08/2015   ETH 127* 05/21/2015    Physical Findings: AIMS: Facial and Oral Movements Muscles of Facial Expression: None, normal Lips and Perioral Area: None, normal Jaw: None, normal Tongue: None, normal,Extremity Movements Upper (arms, wrists, hands, fingers): None, normal Lower (legs, knees, ankles, toes): None, normal, Trunk Movements Neck, shoulders, hips: None, normal, Overall Severity Severity of abnormal movements (highest score from questions above): None, normal Incapacitation due to abnormal movements: None, normal Patient's awareness of abnormal movements (rate only patient's report): No Awareness, Dental Status Current problems with teeth and/or dentures?: Yes Does patient usually wear dentures?: No  CIWA:  CIWA-Ar Total: 7 COWS:  COWS Total Score: 0  Musculoskeletal: Strength & Muscle Tone: within normal limits Gait & Station: normal Patient leans: N/A  Psychiatric Specialty Exam: Review of Systems  Psychiatric/Behavioral: Positive for depression and substance abuse. Negative for suicidal ideas, hallucinations and memory loss. The patient is nervous/anxious. The patient does not have insomnia.   All other systems reviewed and are negative.    Blood pressure 88/66, pulse 86, temperature 98.4 F (36.9 C), temperature source Oral, resp. rate 18, height 6' (1.829 m), weight 240 lb (108.863 kg).Body mass index is 32.54 kg/(m^2).  General Appearance: Disheveled  Eye Contact::  Minimal  Speech:  Low volume , intelligible   Volume:  Decreased  Mood:  Anxious, Depressed and Hopeless   Affect:  Blunt, Depressed and Flat  Thought Process:  Goal Directed and Linear  Orientation:  Full (Time, Place, and Person)  Thought Content:  Denies any hallucinations today   Suicidal Thoughts:  No  Homicidal Thoughts:  No  Memory:  Immediate;   Fair Recent;   Fair Remote;   Poor  Judgement:  Fair  Insight:  Lacking  Psychomotor Activity:  Decreased  Concentration:  Poor  Recall:  Poor  Fund of Knowledge:Fair  Language: Fair  Akathisia:  No  Handed:  Right  AIMS (if indicated):     Assets:  Communication Skills Desire for Improvement Financial Resources/Insurance Housing Leisure Time Physical Health Social Support Transportation Vocational/Educational  ADL's:  Intact  Cognition: WNL  Sleep:  Number of Hours: 6.75   Treatment Plan Summary: Daily contact with patient to assess and evaluate symptoms and progress in treatment and Medication management Medication management to reduce current symptoms to base line and improve the patient's overall level of functioning; Clonidine detox protocols for opioid detox, Haldol 2 mg for agitation/mood control, Cogentin 0.5 mg for EPS, resume Metformin 1,000 mg for diabetes management, Protonix EC 40 mg for acid reflux. Labs reviewed and high levels triglycerides, TSH was normal. 3. Treat health problems as indicated.  4. Develop treatment plan to decrease risk of relapse upon discharge and the need for readmission.  5. Psycho-social education regarding relapse prevention and self care.  6. Health care follow up as needed for medical problems.  7. Review, reconcile, and reinstate any pertinent home medications for other health issues where appropriate. 8. Call for consults with hospitalist for any additional specialty patient care services as needed Patrick NorthAVI, Freyja Govea, MD 08/12/2015, 10:50 AM

## 2015-08-12 NOTE — [facility] Group Notes (Signed)
[facility] LCSW Group Therapy 08/12/2015  1:15 PM   Type of Therapy: Group Therapy  Participation Level: Did Not Attend. Patient invited to participate but declined.   Trent Theisen, MSW, LCSW Clinical Social Worker [facility] [phone]   

## 2015-08-12 NOTE — Progress Notes (Signed)
Psychoeducational Group Note  Date:  08/12/2015 Time:  2356  Group Topic/Focus:  Relapse Prevention Planning:   The focus of this group is to define relapse and discuss the need for planning to combat relapse.  Participation Level: Did Not Attend  Participation Quality:  Not Applicable  Affect:  Not Applicable  Cognitive:  Not Applicable  Insight:  Not Applicable  Engagement in Group: Not Applicable  Additional Comments:  The patient did not attend this evening's A.A. Meeting since he complained of feeling "dizzy".   Hazle CocaGOODMAN, Demitrius Crass S 08/12/2015, 11:56 PM

## 2015-08-12 NOTE — [facility] Group Notes (Signed)
[facility] LCSW Aftercare Discharge Planning Group Note  08/12/2015  8:45 AM  Participation Quality: Did Not Attend. Patient invited to participate but declined.  Wilbert Schouten, MSW, LCSW Clinical Social Worker [facility] [phone]   

## 2015-08-12 NOTE — Progress Notes (Signed)
Recreation Therapy Notes  Date: 05.15.2017 Time: 9:30am Location: [address] Group Room   Group Topic: Stress Management  Goal Area(s) Addresses:  Patient will actively participate in stress management techniques presented during session.   Behavioral Response: Did not attend.   Marykay Lexenise L Dailyn Kempner, LRT/CTRS         Ethaniel Garfield L 08/12/2015 1:52 PM

## 2015-08-13 MED ORDER — HALOPERIDOL 1 MG PO TABS
1.0000 mg | ORAL_TABLET | Freq: Two times a day (BID) | ORAL | Status: DC
  Administered 2015-08-13 – 2015-08-14 (×2): 1 mg via ORAL
  Filled 2015-08-13 (×6): qty 1

## 2015-08-13 MED ORDER — LORAZEPAM 1 MG PO TABS
1.0000 mg | ORAL_TABLET | Freq: Once | ORAL | Status: AC
  Administered 2015-08-13: 1 mg via ORAL
  Filled 2015-08-13: qty 1

## 2015-08-13 MED ORDER — LAMOTRIGINE 25 MG PO TABS
25.0000 mg | ORAL_TABLET | Freq: Two times a day (BID) | ORAL | Status: DC
  Administered 2015-08-13 – 2015-08-14 (×2): 25 mg via ORAL
  Filled 2015-08-13 (×6): qty 1

## 2015-08-13 NOTE — Progress Notes (Signed)
Patient ID: Gary Boone, male   DOB: 10/01/1970, 45 y.o.   MRN: [id number]  [facility]  08/13/2015 11:12 AM Gary Boone  MRN:  [id number] Subjective: The 45 year old Caucasian male with long history of opioid abuse, probable bipolar disorder. Patient was discussed in treatment team this morning. Today patient states that he is feeling a little better and looking forward to going back to his halfway house. States that he has found a sponsor .States that Lamictal has worked well for him in the past and he would like to get started on it. Reports sleeping well and had breakfast this morning. He denies any craving and states he would like to do better. Denies any suicidal thoughts. Denies any withdrawal symptoms this morning. He would like to wean off the Haldol and titrate the Lamictal.  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 Opioid use disorder, severe, dependence (HCC) [F11.20] 08/09/2015  . 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 History of spinal surgery [Z98.890] 08/09/2015   Total Time spent with patient: 20 minutes  Past Psychiatric History: Depression, Opioid use disorder  Past Medical History:  Past Medical History  Diagnosis Date  . Depression   . Substance abuse   . Diabetes mellitus without complication (HCC)   . Hepatitis C   . DDD (degenerative disc disease), cervical   . Bipolar 2 disorder (HCC)   . PTSD (post-traumatic stress disorder)     Past Surgical History  Procedure Laterality Date  . Cholecystectomy     Family History:  Family History  Problem Relation Age of Onset  . Drug abuse Father   . Schizophrenia Paternal Aunt    Family Psychiatric  History: SeeHPI Social History:  History  Alcohol Use  . Yes    Comment: etoh abuse      History  Drug Use  . Yes  . Special: Marijuana, Cocaine, Methamphetamines    Comment: heroin, opiates    Social History   Social History  . Marital Status: Single    Spouse Name: N/A  . Number of Children: N/A  . Years of Education: N/A   Social History Main Topics  . Smoking status: Current Every Day Smoker -- 2.50 packs/day    Types: Cigarettes  . Smokeless tobacco: None  . Alcohol Use: Yes     Comment: etoh abuse  . Drug Use: Yes    Special: Marijuana, Cocaine, Methamphetamines     Comment: heroin, opiates  . Sexual Activity: Yes   Other Topics Concern  . None   Social History Narrative   Additional Social History:    Sleep: Good  Appetite:  Fair  Current Medications: Current Facility-Administered Medications  Medication Dose Route Frequency Provider Last Rate Last Dose  . alum & mag hydroxide-simeth (MAALOX/MYLANTA) 200-200-20 MG/5ML suspension 30 mL  30 mL Oral Q4H PRN Sanjuana KavaAgnes I Nwoko, NP   30 mL at 08/11/15 2056  . benztropine (COGENTIN) tablet 0.5 mg  0.5 mg Oral BID Jomarie LongsSaramma Eappen, MD   0.5 mg at 08/13/15 0836  . cloNIDine (CATAPRES) tablet 0.1 mg  0.1 mg Oral BH-qamhs Sanjuana KavaAgnes I Nwoko, NP   0.1 mg at 08/13/15 [id number]   Followed by  . [START ON 08/14/2015] cloNIDine (CATAPRES) tablet 0.1 mg  0.1 mg Oral QAC breakfast Sanjuana KavaAgnes I Nwoko, NP      .  dicyclomine (BENTYL) tablet 20 mg  20 mg Oral Q6H PRN Sanjuana Kava, NP   20 mg at 08/13/15 0836  . diphenhydrAMINE (BENADRYL) capsule 25 mg  25 mg Oral Q8H PRN Jomarie Longs, MD   25 mg at 08/13/15 1058   Or  . diphenhydrAMINE (BENADRYL) injection 25 mg  25 mg Intramuscular Q8H PRN Jomarie Longs, MD   25 mg at 08/12/15 1324  . haloperidol (HALDOL) tablet 1 mg  1 mg Oral BID Mohamedamin Nifong, MD      . haloperidol (HALDOL) tablet 5 mg  5 mg Oral Q8H PRN Saramma Eappen, MD   5 mg at 08/13/15 1058   Or  . haloperidol lactate (HALDOL) injection 5 mg  5 mg Intramuscular Q8H PRN Jomarie Longs, MD   5 mg at 08/12/15 1326  .  hydrOXYzine (ATARAX/VISTARIL) tablet 25 mg  25 mg Oral Q6H PRN Sanjuana Kava, NP   25 mg at 08/11/15 2057  . lamoTRIgine (LAMICTAL) chewable tablet 25 mg  25 mg Oral BID Ladale Sherburn, MD      . LORazepam (ATIVAN) tablet 1 mg  1 mg Oral Q6H PRN Jomarie Longs, MD   1 mg at 08/13/15 1058   Or  . LORazepam (ATIVAN) injection 1 mg  1 mg Intramuscular Q6H PRN Jomarie Longs, MD   1 mg at 08/12/15 1324  . magnesium hydroxide (MILK OF MAGNESIA) suspension 30 mL  30 mL Oral Daily PRN Sanjuana Kava, NP      . metFORMIN (GLUCOPHAGE) tablet 1,000 mg  1,000 mg Oral BID WC Sanjuana Kava, NP   1,000 mg at 08/13/15 0836  . methocarbamol (ROBAXIN) tablet 500 mg  500 mg Oral Q8H PRN Sanjuana Kava, NP   500 mg at 08/12/15 1823  . naproxen (NAPROSYN) tablet 500 mg  500 mg Oral BID PRN Sanjuana Kava, NP   500 mg at 08/13/15 0836  . nicotine (NICODERM CQ - dosed in mg/24 hours) patch 21 mg  21 mg Transdermal Daily Court Joy, PA-C   21 mg at 08/13/15 0845  . ondansetron (ZOFRAN-ODT) disintegrating tablet 4 mg  4 mg Oral Q6H PRN Sanjuana Kava, NP      . pantoprazole (PROTONIX) EC tablet 40 mg  40 mg Oral Daily Sanjuana Kava, NP   40 mg at 08/13/15 9629    Lab Results:  No results found for this or any previous visit (from the past 48 hour(s)).  Blood Alcohol level:  Lab Results  Component Value Date   ETH <5 08/08/2015   ETH 127* 05/21/2015    Physical Findings: AIMS: Facial and Oral Movements Muscles of Facial Expression: None, normal Lips and Perioral Area: None, normal Jaw: None, normal Tongue: None, normal,Extremity Movements Upper (arms, wrists, hands, fingers): None, normal Lower (legs, knees, ankles, toes): None, normal, Trunk Movements Neck, shoulders, hips: None, normal, Overall Severity Severity of abnormal movements (highest score from questions above): None, normal Incapacitation due to abnormal movements: None, normal Patient's awareness of abnormal movements (rate only patient's  report): No Awareness, Dental Status Current problems with teeth and/or dentures?: Yes Does patient usually wear dentures?: No  CIWA:  CIWA-Ar Total: 7 COWS:  COWS Total Score: 7  Musculoskeletal: Strength & Muscle Tone: within normal limits Gait & Station: normal Patient leans: N/A  Psychiatric Specialty Exam: Review of Systems  Psychiatric/Behavioral: Positive for depression and substance abuse. Negative for suicidal ideas, hallucinations and memory loss. The patient is nervous/anxious. The patient  does not have insomnia.   All other systems reviewed and are negative.    Blood pressure 101/47, pulse 90, temperature 98.3 F (36.8 C), temperature source Oral, resp. rate 16, height 6' (1.829 m), weight 240 lb (108.863 kg).Body mass index is 32.54 kg/(m^2).  General Appearance: Disheveled  Eye Contact::  Minimal  Speech:  Low volume , intelligible   Volume:  Decreased  Mood:  Better today   Affect:  Patient smiling this morning   Thought Process:  Goal Directed and Linear  Orientation:  Full (Time, Place, and Person)  Thought Content:  Denies any hallucinations today   Suicidal Thoughts:  No  Homicidal Thoughts:  No  Memory:  Immediate;   Fair Recent;   Fair Remote;   Poor  Judgement:  Fair  Insight:  Improving   Psychomotor Activity:  Decreased  Concentration:  Poor  Recall:  Poor  Fund of Knowledge:Fair  Language: Fair  Akathisia:  No  Handed:  Right  AIMS (if indicated):     Assets:  Communication Skills Desire for Improvement Financial Resources/Insurance Housing Leisure Time Physical Health Social Support Transportation Vocational/Educational  ADL's:  Intact  Cognition: WNL  Sleep:  Number of Hours: 6.25   Treatment Plan Summary: Daily contact with patient to assess and evaluate symptoms and progress in treatment and Medication management Medication management to reduce current symptoms to base line and improve the patient's overall level of functioning;  Clonidine detox protocols for opioid detox,, decrease Haldol to 1 mg twice daily, start Lamictal at 25 mg twice daily, Cogentin 0.5 mg for EPS, resume Metformin 1,000 mg for diabetes management, Protonix EC 40 mg for acid reflux. Labs reviewed and high levels triglycerides, TSH was normal. 3. Treat health problems as indicated.  4. Develop treatment plan to decrease risk of relapse upon discharge and the need for readmission.  5. Psycho-social education regarding relapse prevention and self care.  6. Health care follow up as needed for medical problems.  7. Review, reconcile, and reinstate any pertinent home medications for other health issues where appropriate. 8. Call for consults with hospitalist for any additional specialty patient care services as needed Patrick North, MD 08/13/2015, 11:12 AM

## 2015-08-13 NOTE — Progress Notes (Signed)
D-  Patient has had an increase in his mood is less irritable and anxious this shift.  Patient reported feeling a little better and a decrease in his withdrawal symptoms.  Patient engaged in group activities and had no issues of behavioral dyscontrol.    A- Assess patient for safety, offer medications as prescribed, offer 1:1 staff talks.   R-  Patient was able to contract for safety.

## 2015-08-13 NOTE — Progress Notes (Signed)
Recreation Therapy Notes  Animal-Assisted Activity (AAA) Program Checklist/Progress Notes Patient Eligibility Criteria Checklist & Daily Group note for Rec Tx Intervention  Date: 05.15.2017 Time: 2:45pm Location: [address] Dayroom    AAA/T Program Assumption of Risk Form signed by Patient/ or Parent Legal Guardian Yes  Patient is free of allergies or sever asthma Yes  Patient reports no fear of animals Yes  Patient reports no history of cruelty to animals Yes  Patient understands his/her participation is voluntary Yes  Behavioral Response: Did not attend.    Rockie Schnoor L Yossi Hinchman, LRT/CTRS        [place] L 08/13/2015 2:57 PM 

## 2015-08-13 NOTE — [facility] Group Notes (Signed)
[facility] LCSW Group Therapy 08/13/2015  1:15 PM   Type of Therapy: Group Therapy  Participation Level: Did Not Attend. Patient invited to participate but declined.   Zareya Tuckett, MSW, LCSW Clinical Social Worker [facility] [phone]   

## 2015-08-13 NOTE — [facility] Group Notes (Signed)
The focus of this group is to educate the patient on the purpose and policies of crisis stabilization and provide a format to answer questions about their admission.  The group details unit policies and expectations of patients while admitted.  Patient did not attend 0900 nurse education orientation group this morning.  Patient stayed in bed.   

## 2015-08-13 NOTE — Progress Notes (Signed)
D: Pt denies SI/HI/AVH. Pt is pleasant and cooperative. Pt up  Only to get something for anxiety then back sleep  A: Pt was offered support and encouragement. Pt was given scheduled medications. Pt was encourage to attend groups. Q 15 minute checks were done for safety.  R:Pt attends groups and interacts well with peers and staff. Pt is taking medication. Pt has no complaints.Pt receptive to treatment and safety maintained on unit.

## 2015-08-13 NOTE — Plan of Care (Signed)
Problem: Ineffective individual coping Goal: STG: Patient will remain free from self harm Outcome: Progressing Pt safe on the unit at this time     

## 2015-08-14 MED ORDER — BENZTROPINE MESYLATE 0.5 MG PO TABS: 0.5000 mg | ORAL_TABLET | Freq: Two times a day (BID) | ORAL | Status: DC

## 2015-08-14 MED ORDER — NICOTINE 21 MG/24HR TD PT24: 21.0000 mg | MEDICATED_PATCH | Freq: Every day | TRANSDERMAL | Status: DC

## 2015-08-14 MED ORDER — METFORMIN HCL 1000 MG PO TABS
1000.0000 mg | ORAL_TABLET | Freq: Two times a day (BID) | ORAL | Status: DC
Start: 2015-08-14 — End: 2015-08-22

## 2015-08-14 MED ORDER — HALOPERIDOL 1 MG PO TABS: 1.0000 mg | ORAL_TABLET | Freq: Two times a day (BID) | ORAL | Status: DC

## 2015-08-14 MED ORDER — HYDROXYZINE HCL 25 MG PO TABS: 25.0000 mg | ORAL_TABLET | Freq: Four times a day (QID) | ORAL | Status: DC | PRN

## 2015-08-14 MED ORDER — LAMOTRIGINE 25 MG PO TABS: 25.0000 mg | ORAL_TABLET | Freq: Two times a day (BID) | ORAL | Status: DC

## 2015-08-14 MED ORDER — CLONIDINE HCL 0.1 MG PO TABS: 0.1000 mg | ORAL_TABLET | Freq: Every day | ORAL | Status: DC

## 2015-08-14 MED ORDER — PANTOPRAZOLE SODIUM 40 MG PO TBEC: 40.0000 mg | DELAYED_RELEASE_TABLET | Freq: Every day | ORAL | Status: DC
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
## 2015-08-14 NOTE — [facility] Group Notes (Signed)
Patient did not attend group.

## 2015-08-14 NOTE — Progress Notes (Signed)
  [facility] :  Will you be returning to the same living situation after discharge:  Yes,  [facility] At discharge, do you have transportation home?: Yes,  bus pass in chart Do you have the ability to pay for your medications: Yes,  Medicare  Release of information consent forms completed and submitted to medical records by CSW.  Patient to Follow up at: Follow-up Information    Follow up with [facility]  On 08/19/2015.   Why:  Appt on this date at 9:30AM with Dr. Tollie EthPlummer for assessment/suboxone treatment. Please bring photo ID and Medicare Card/Medicaid Card with you to this appt.    Contact information:   [address] [place], [place], [place] [address] [phone]       [facility]   Why:  Walk in between 8am-9am Monday through Friday for hospital follow-up/medication management/assessment for counseling services.    Contact information:   [address] , [place] [address] Phone: [phone] Fax: [phone]      Next level of care provider [facility] Link:no  Safety Planning and Suicide Prevention discussed: Yes,  SPE completed with pt, as he declined to consent to family contact. SPI pamphlet and Mobile Crisis information provided to pt and he was encouraged to share information with support network, ask questions, and talk about any concerns relating to SPE.  Have you used any form of tobacco in the last 30 days? (Cigarettes, Smokeless Tobacco, Cigars, and/or Pipes): Yes  Has patient been referred to the Quitline?: Patient refused referral  Patient has been referred for addiction treatment: Yes  Smart, Kristal Perl LCSW 08/14/2015, 9:42 AM

## 2015-08-14 NOTE — [facility] Suicide Risk Assessment (Signed)
[facility] INPATIENT:  Family/Significant Other Suicide Prevention Education  Suicide Prevention Education:  Patient Refusal for Family/Significant Other Suicide Prevention Education: The patient Gary CaoJeremy Boone has refused to provide written consent for family/significant other to be provided Family/Significant Other Suicide Prevention Education during admission and/or prior to discharge.  Physician notified.  SPE completed with pt, as pt refused to consent to family contact. SPI pamphlet provided to pt and pt was encouraged to share information with support network, ask questions, and talk about any concerns relating to SPE. Pt denies access to guns/firearms and verbalized understanding of information provided. Mobile Crisis information also provided to pt.    Smart, Linley Moxley LCSW 08/14/2015, 9:41 AM

## 2015-08-14 NOTE — Discharge Summary (Signed)
Physician Discharge Summary Note  Patient:  Gary Boone is an 45 y.o., male MRN:  [id number] DOB:  [birth year] Patient phone:  [phone] (home)  Patient address:   [address] 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 Opioid use disorder, severe, dependence (HCC) [F11.20] 08/09/2015  . 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 History of spinal surgery [Z98.890] 08/09/2015   Past Psychiatric History:  See above noted  Past Medical History:  Past Medical History  Diagnosis Date  . Depression   . Substance abuse   . Diabetes mellitus without complication (HCC)   . Hepatitis C   . DDD (degenerative disc disease), cervical   . Bipolar 2 disorder (HCC)   . PTSD (post-traumatic stress disorder)     Past Surgical History  Procedure Laterality Date  . Cholecystectomy     Family History:  Family History  Problem Relation Age of Onset  . Drug abuse Father   . Schizophrenia Paternal Aunt    Family Psychiatric  History:  See above noted Social History:  History  Alcohol Use  . Yes    Comment: etoh abuse     History  Drug Use  . Yes  . Special: Marijuana, Cocaine, Methamphetamines    Comment: heroin, opiates    Social History   Social History  . Marital Status: Single    Spouse Name: N/A  . Number of Children: N/A  . Years of Education: N/A   Social History Main Topics  . Smoking status: Current Every Day Smoker -- 2.50 packs/day    Types: Cigarettes  . Smokeless tobacco: None  . Alcohol Use: Yes     Comment: etoh abuse  . Drug Use: Yes    Special: Marijuana,  Cocaine, Methamphetamines     Comment: heroin, opiates  . Sexual Activity: Yes   Other Topics Concern  . 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  He was treated with medications listed below.  Medical problems were identified and treated as needed.  Home medications were restarted as appropriate.  Improvement was monitored by observation and Barbaraann Cao daily report of symptom reduction.  Emotional and mental status was monitored by daily self inventory reports completed by Barbaraann Cao and clinical staff.  Patient reported continued improvement, denied any new concerns.  Patient had been compliant on medications and denied side effects.  Support and encouragement was provided.    Patient did well during inpatient stay.  At time of discharge, patient rated both depression and anxiety levels to be manageable and minimal.  Patient was able to identify the triggers of emotional crises and de-stabilizations.  Patient identified the positive things in life that would help in dealing with feelings of loss, depression and unhealthy or abusive tendencies.         [name] was evaluated by the treatment team for stability and plans for continued recovery upon discharge.  He was offered further treatment options upon discharge including Residential, Intensive Outpatient and Outpatient treatment. He will follow  up with agencies listed below for medication management and counseling.  Encouraged patient to maintain satisfactory support network and home environment.  Advised to adhere to medication compliance and outpatient treatment follow up.      Pamela Maddy motivation was an integral factor for scheduling further treatment.  Employment, transportation, bed availability, health status, family support, and any pending legal issues were  also considered during his hospital stay.  Upon completion of this admission the patient was both mentally and medically stable for discharge denying suicidal/homicidal ideation, auditory/visual/tactile hallucinations, delusional thoughts and paranoia.       Physical Findings: AIMS: Facial and Oral Movements Muscles of Facial Expression: None, normal Lips and Perioral Area: None, normal Jaw: None, normal Tongue: None, normal,Extremity Movements Upper (arms, wrists, hands, fingers): None, normal Lower (legs, knees, ankles, toes): None, normal, Trunk Movements Neck, shoulders, hips: None, normal, Overall Severity Severity of abnormal movements (highest score from questions above): None, normal Incapacitation due to abnormal movements: None, normal Patient's awareness of abnormal movements (rate only patient's report): No Awareness, Dental Status Current problems with teeth and/or dentures?: Yes Does patient usually wear dentures?: No  CIWA:  CIWA-Ar Total: 7 COWS:  COWS Total Score: 10  Musculoskeletal: Strength & Muscle Tone: within normal limits Gait & Station: normal Patient leans: N/A  Psychiatric Specialty Exam:  SEE above noted Review of Systems  Psychiatric/Behavioral: Negative for suicidal ideas and hallucinations. Depression: improving.  All other systems reviewed and are negative.   Blood pressure 111/62, pulse 59, temperature 98.1 F (36.7 C), temperature source Oral, resp. rate 18, height 6' (1.829 m), weight 108.863 kg (240 lb).Body mass index is 32.54 kg/(m^2).  Have you used any form of tobacco in the last 30 days? (Cigarettes, Smokeless Tobacco, Cigars, and/or Pipes): Yes  Has this patient used any form of tobacco in the last 30 days? (Cigarettes, Smokeless Tobacco, Cigars, and/or Pipes) Yes, N/A  Blood Alcohol level:  Lab Results  Component Value Date   ETH <5 08/08/2015   ETH 127* 05/21/2015    Metabolic Disorder Labs:  No results found for: HGBA1C, MPG No  results found for: PROLACTIN Lab Results  Component Value Date   CHOL 140 08/10/2015   TRIG 230* 08/10/2015   HDL 26* 08/10/2015   CHOLHDL 5.4 08/10/2015   VLDL 46* 08/10/2015   LDLCALC 68 08/10/2015    See Psychiatric Specialty Exam and Suicide Risk Assessment completed by Attending Physician prior to discharge.  Discharge destination:  Home  Is patient on multiple antipsychotic therapies at discharge:  No   Has Patient had three or more failed trials of antipsychotic monotherapy by history:  No  Recommended Plan for Multiple Antipsychotic Therapies: NA     Medication List    STOP taking these medications        buprenorphine-naloxone 8-2 MG Subl SL tablet  Commonly known as:  SUBOXONE     calcium carbonate 500 MG chewable tablet  Commonly known as:  TUMS - dosed in mg elemental calcium     escitalopram 10 MG tablet  Commonly known as:  LEXAPRO     escitalopram 20 MG tablet  Commonly known as:  LEXAPRO     gabapentin 800 MG tablet  Commonly known as:  NEURONTIN     ibuprofen 800 MG tablet  Commonly known as:  ADVIL,MOTRIN     methocarbamol 500 MG tablet  Commonly known as:  ROBAXIN     omeprazole 20 MG capsule  Commonly known as:  PRILOSEC  Replaced by:  pantoprazole 40 MG tablet      TAKE these medications      Indication   benztropine 0.5 MG tablet  Commonly known as:  COGENTIN  Take 1 tablet (0.5 mg total) by mouth 2 (two) times daily.   Indication:  Extrapyramidal Reaction caused by Medications     cloNIDine 0.1 MG tablet  Commonly known as:  CATAPRES  Take 1 tablet (0.1 mg total) by mouth daily before breakfast.   Indication:  High Blood Pressure     haloperidol 1 MG tablet  Commonly known as:  HALDOL  Take 1 tablet (1 mg total) by mouth 2 (two) times daily.   Indication:  mood stabilization     hydrOXYzine 25 MG tablet  Commonly known as:  ATARAX/VISTARIL  Take 1 tablet (25 mg total) by mouth every 6 (six) hours as needed for anxiety.    Indication:  Anxiety Neurosis     lamoTRIgine 25 MG tablet  Commonly known as:  LAMICTAL  Take 1 tablet (25 mg total) by mouth 2 (two) times daily.   Indication:  mood stabilization     metFORMIN 1000 MG tablet  Commonly known as:  GLUCOPHAGE  Take 1 tablet (1,000 mg total) by mouth 2 (two) times daily with a meal.   Indication:  Type 2 Diabetes     nicotine 21 mg/24hr patch  Commonly known as:  NICODERM CQ - dosed in mg/24 hours  Place 1 patch (21 mg total) onto the skin daily.   Indication:  Nicotine Addiction     pantoprazole 40 MG tablet  Commonly known as:  PROTONIX  Take 1 tablet (40 mg total) by mouth daily.   Indication:  Gastroesophageal Reflux Disease           Follow-up Information    Follow up with [facility]  On 08/19/2015.   Why:  Appt on this date at 9:30AM with Dr. Tollie EthPlummer for assessment/suboxone treatment. Please bring photo ID and Medicare Card/Medicaid Card with you to this appt.    Contact information:   [address] [place], [place], [place] [address] [phone]       [facility] with [facility].   Why:  Walk in between 8am-9am Monday through Friday for hospital follow-up/medication management/assessment for counseling services.    Contact information:   [address] [place], [place] [address] Phone: [phone] Fax: [phone]      Follow-up recommendations:  Activity:  as tol Diet:  as tol  Comments:  1.  Take all your medications as prescribed.   2.  Report any adverse side effects to outpatient provider. 3.  Patient instructed to not use alcohol or illegal drugs while on prescription medicines. 4.  In the event of worsening symptoms, instructed patient to call 911, the crisis hotline or go to nearest emergency room for evaluation of symptoms.  Signed: Lindwood QuaSheila May Shamikia Linskey, NP [facility] 08/14/2015, 10:54 AM

## 2015-08-14 NOTE — Progress Notes (Signed)
Recreation Therapy Notes  Date: 05.17.2017 Time: 9:30am Location: [address] Group Room   Group Topic: Stress Management  Goal Area(s) Addresses:  Patient will actively participate in stress management techniques presented during session.   Behavioral Response: Did not attend.   Kaily Wragg L Jalien Weakland, LRT/CTRS        Luzelena Heeg L 08/14/2015 2:10 PM 

## 2015-08-14 NOTE — [facility] Suicide Risk Assessment (Signed)
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 Opioid use disorder, severe, dependence (HCC) [F11.20] 08/09/2015  . 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 History of spinal surgery [Z98.890] 08/09/2015    Total Time spent with patient: 20 minutes  Musculoskeletal: Strength & Muscle Tone: within normal limits Gait & Station: normal Patient leans: N/A  Psychiatric Specialty Exam: Review of Systems  All other systems reviewed and are negative.   Blood pressure 111/62, pulse 59, temperature 98.1 F (36.7 C), temperature source Oral, resp. rate 18, height 6' (1.829 m), weight 240 lb (108.863 kg).Body mass index is 32.54 kg/(m^2).  General Appearance: Casual  Eye Contact::  Fair  Speech:  Clear and Coherent409  Volume:  Normal  Mood:  Euthymic  Affect:  Congruent  Thought Process:  Coherent  Orientation:  Full (Time, Place, and Person)  Thought Content:  WDL  Suicidal Thoughts:  No  Homicidal Thoughts:  No  Memory:  Immediate;   Fair Recent;   Fair Remote;   Fair  Judgement:  Fair  Insight:  Present  Psychomotor Activity:  Normal  Concentration:  Fair  Recall:  FiservFair  Fund of Knowledge:Fair  Language: Fair  Akathisia:  No  Handed:  Right  AIMS (if indicated):     Assets:  Communication Skills Desire for Improvement  Sleep:  Number of Hours: 6.75  Cognition: WNL  ADL's:  Intact   Mental Status Per Nursing Assessment::   On Admission:  Suicidal ideation indicated by patient  Demographic Factors:  Male and Caucasian  Loss Factors: Financial problems/change in socioeconomic status  Historical Factors: Impulsivity  Risk Reduction Factors:   Positive social support and Positive coping skills or problem  solving skills  Continued Clinical Symptoms:  Improved mood and completed opioid detox  Cognitive Features That Contribute To Risk:  None    Suicide Risk:  Minimal: No identifiable suicidal ideation.  Patients presenting with no risk factors but with morbid ruminations; may be classified as minimal risk based on the severity of the depressive symptoms  Follow-up Information    Follow up with [facility]  On 08/19/2015.   Why:  Appt on this date at 9:30AM with Dr. Tollie EthPlummer for assessment/suboxone treatment. Please bring photo ID and Medicare Card/Medicaid Card with you to this appt.    Contact information:   [address] [place], [place], [place] [address] [phone]       [facility] with [facility].   Why:  Walk in between 8am-9am Monday through Friday for hospital follow-up/medication management/assessment for counseling services.    Contact information:   [address] [place], [place] [address] Phone: [phone] Fax: [phone]      Plan Of Care/Follow-up recommendations:  Activity:  normal Diet:  normal  Follow up with [facility] for f/u. Patient aware of safety plan if he has suicidal thoughts. Take medications per discharge instructions.   Patrick NorthAVI, Mayrin Schmuck, MD 08/14/2015, 11:02 AM

## 2015-08-14 NOTE — Progress Notes (Signed)
BHH Group Notes:  (Nursing/MHT/Case Management/Adjunct)  Date:  08/14/2015  Time:  2:47 PM  Type of Therapy:  Psychoeducational Skills  Participation Level:  Active  Participation Quality:  Appropriate, Attentive, Sharing and Supportive  Affect:  Appropriate  Cognitive:  Appropriate  Insight:  Appropriate, Good and Improving  Engagement in Group:  Developing/Improving, Improving and Supportive  Modes of Intervention:  Discussion and Support  Summary of Progress/Problems: Pt attended group, and was appropriate. Group topic was personal development, identifying values and morals and situations that change values. Pt explained how to change values and live a oriented lifestyles.   [name], [name] 08/14/2015, 2:47 PM

## 2015-08-21 ENCOUNTER — Emergency Department (HOSPITAL_COMMUNITY)
Admission: EM | Admit: 2015-08-21 | Discharge: 2015-08-21 | Disposition: A | Discharge status: Home / Self Care | Payer: Medicare Other | Attending: Emergency Medicine | Admitting: Emergency Medicine

## 2015-08-21 ENCOUNTER — Encounter (HOSPITAL_COMMUNITY): Payer: Self-pay | Admitting: Emergency Medicine

## 2015-08-21 ENCOUNTER — Inpatient Hospital Stay (HOSPITAL_COMMUNITY)
Admission: AD | Admit: 2015-08-21 | Discharge: 2015-08-22 | DRG: 897 | Disposition: A | Discharge status: Home / Self Care | Payer: Medicare Other | Source: Intra-hospital | Attending: Psychiatry | Admitting: Psychiatry

## 2015-08-21 ENCOUNTER — Encounter (HOSPITAL_COMMUNITY): Payer: Self-pay

## 2015-08-21 ENCOUNTER — Emergency Department (HOSPITAL_COMMUNITY): Payer: Medicare Other

## 2015-08-21 DIAGNOSIS — Z7984 Long term (current) use of oral hypoglycemic drugs: Secondary | ICD-10-CM | POA: Insufficient documentation

## 2015-08-21 DIAGNOSIS — E119 Type 2 diabetes mellitus without complications: Secondary | ICD-10-CM | POA: Diagnosis present

## 2015-08-21 DIAGNOSIS — Y999 Unspecified external cause status: Secondary | ICD-10-CM | POA: Diagnosis not present

## 2015-08-21 DIAGNOSIS — R45851 Suicidal ideations: Secondary | ICD-10-CM

## 2015-08-21 DIAGNOSIS — Y9289 Other specified places as the place of occurrence of the external cause: Secondary | ICD-10-CM | POA: Insufficient documentation

## 2015-08-21 DIAGNOSIS — W131XXA Fall from, out of or through bridge, initial encounter: Secondary | ICD-10-CM | POA: Diagnosis not present

## 2015-08-21 DIAGNOSIS — F1420 Cocaine dependence, uncomplicated: Secondary | ICD-10-CM | POA: Diagnosis present

## 2015-08-21 DIAGNOSIS — F17210 Nicotine dependence, cigarettes, uncomplicated: Secondary | ICD-10-CM | POA: Diagnosis present

## 2015-08-21 DIAGNOSIS — F3181 Bipolar II disorder: Secondary | ICD-10-CM | POA: Diagnosis not present

## 2015-08-21 DIAGNOSIS — F1020 Alcohol dependence, uncomplicated: Secondary | ICD-10-CM | POA: Diagnosis present

## 2015-08-21 DIAGNOSIS — Y903 Blood alcohol level of 60-79 mg/100 ml: Secondary | ICD-10-CM | POA: Diagnosis present

## 2015-08-21 DIAGNOSIS — F4310 Post-traumatic stress disorder, unspecified: Secondary | ICD-10-CM | POA: Insufficient documentation

## 2015-08-21 DIAGNOSIS — Y9339 Activity, other involving climbing, rappelling and jumping off: Secondary | ICD-10-CM | POA: Insufficient documentation

## 2015-08-21 DIAGNOSIS — M5030 Other cervical disc degeneration, unspecified cervical region: Secondary | ICD-10-CM | POA: Diagnosis not present

## 2015-08-21 DIAGNOSIS — Z8659 Personal history of other mental and behavioral disorders: Secondary | ICD-10-CM | POA: Diagnosis not present

## 2015-08-21 DIAGNOSIS — F1120 Opioid dependence, uncomplicated: Secondary | ICD-10-CM | POA: Diagnosis present

## 2015-08-21 DIAGNOSIS — B1920 Unspecified viral hepatitis C without hepatic coma: Secondary | ICD-10-CM | POA: Diagnosis not present

## 2015-08-21 DIAGNOSIS — T148 Other injury of unspecified body region: Secondary | ICD-10-CM | POA: Insufficient documentation

## 2015-08-21 LAB — CBC
HEMATOCRIT: 38.5 % — AB (ref 39.0–52.0)
HEMOGLOBIN: 13.7 g/dL (ref 13.0–17.0)
MCH: 31.1 pg (ref 26.0–34.0)
MCHC: 35.6 g/dL (ref 30.0–36.0)
MCV: 87.3 fL (ref 78.0–100.0)
Platelets: 273 10*3/uL (ref 150–400)
RBC: 4.41 MIL/uL (ref 4.22–5.81)
RDW: 13.2 % (ref 11.5–15.5)
WBC: 14.1 10*3/uL — ABNORMAL HIGH (ref 4.0–10.5)

## 2015-08-21 LAB — COMPREHENSIVE METABOLIC PANEL
ALBUMIN: 4.2 g/dL (ref 3.5–5.0)
ALK PHOS: 113 U/L (ref 38–126)
ALT: 121 U/L — AB (ref 17–63)
AST: 117 U/L — AB (ref 15–41)
Anion gap: 10 (ref 5–15)
BUN: 9 mg/dL (ref 6–20)
CALCIUM: 8.9 mg/dL (ref 8.9–10.3)
CO2: 21 mmol/L — AB (ref 22–32)
CREATININE: 0.68 mg/dL (ref 0.61–1.24)
Chloride: 102 mmol/L (ref 101–111)
GFR calc non Af Amer: >60 mL/min (ref >60)
GLUCOSE: 179 mg/dL — AB (ref 65–99)
Potassium: 3.9 mmol/L (ref 3.5–5.1)
SODIUM: 133 mmol/L — AB (ref 135–145)
Total Bilirubin: 0.7 mg/dL (ref 0.3–1.2)
Total Protein: 7.3 g/dL (ref 6.5–8.1)

## 2015-08-21 LAB — ETHANOL: Alcohol, Ethyl (B): 75 mg/dL — ABNORMAL HIGH (ref <5)

## 2015-08-21 LAB — RAPID URINE DRUG SCREEN, HOSP PERFORMED
AMPHETAMINES: NOT DETECTED
BARBITURATES: NOT DETECTED
Benzodiazepines: NOT DETECTED
Cocaine: POSITIVE — AB
Opiates: NOT DETECTED
TETRAHYDROCANNABINOL: NOT DETECTED

## 2015-08-21 LAB — SALICYLATE LEVEL: Salicylate Lvl: <4 mg/dL (ref 2.8–30.0)

## 2015-08-21 LAB — ACETAMINOPHEN LEVEL: Acetaminophen (Tylenol), Serum: <10 ug/mL — ABNORMAL LOW (ref 10–30)

## 2015-08-21 MED ORDER — VITAMIN B-1 100 MG PO TABS
100.0000 mg | ORAL_TABLET | Freq: Every day | ORAL | Status: DC
  Administered 2015-08-22: 100 mg via ORAL
  Filled 2015-08-21 (×4): qty 1

## 2015-08-21 MED ORDER — METFORMIN HCL 500 MG PO TABS
1000.0000 mg | ORAL_TABLET | Freq: Two times a day (BID) | ORAL | Status: DC
  Administered 2015-08-21: 1000 mg via ORAL
  Filled 2015-08-21 (×3): qty 2

## 2015-08-21 MED ORDER — LAMOTRIGINE 25 MG PO TABS
25.0000 mg | ORAL_TABLET | Freq: Two times a day (BID) | ORAL | Status: DC
  Administered 2015-08-21 – 2015-08-22 (×2): 25 mg via ORAL
  Filled 2015-08-21 (×8): qty 1

## 2015-08-21 MED ORDER — NICOTINE POLACRILEX 2 MG MT GUM
2.0000 mg | CHEWING_GUM | OROMUCOSAL | Status: DC | PRN
  Filled 2015-08-21: qty 1

## 2015-08-21 MED ORDER — PANTOPRAZOLE SODIUM 40 MG PO TBEC
40.0000 mg | DELAYED_RELEASE_TABLET | Freq: Every day | ORAL | Status: DC
  Administered 2015-08-21: 40 mg via ORAL
  Filled 2015-08-21: qty 1

## 2015-08-21 MED ORDER — METFORMIN HCL 500 MG PO TABS
1000.0000 mg | ORAL_TABLET | Freq: Two times a day (BID) | ORAL | Status: DC
  Administered 2015-08-22: 1000 mg via ORAL
  Filled 2015-08-21 (×6): qty 2

## 2015-08-21 MED ORDER — ADULT MULTIVITAMIN W/MINERALS CH
1.0000 | ORAL_TABLET | Freq: Every day | ORAL | Status: DC
  Administered 2015-08-22: 1 via ORAL
  Filled 2015-08-21 (×4): qty 1

## 2015-08-21 MED ORDER — LORAZEPAM 1 MG PO TABS: 1.0000 mg | ORAL_TABLET | Freq: Four times a day (QID) | ORAL | Status: DC | PRN

## 2015-08-21 MED ORDER — ONDANSETRON 4 MG PO TBDP: 4.0000 mg | ORAL_TABLET | Freq: Four times a day (QID) | ORAL | Status: DC | PRN

## 2015-08-21 MED ORDER — PANTOPRAZOLE SODIUM 40 MG PO TBEC
40.0000 mg | DELAYED_RELEASE_TABLET | Freq: Every day | ORAL | Status: DC
  Administered 2015-08-22: 40 mg via ORAL
  Filled 2015-08-21 (×4): qty 1

## 2015-08-21 MED ORDER — LORAZEPAM 1 MG PO TABS: 1.0000 mg | ORAL_TABLET | Freq: Two times a day (BID) | ORAL | Status: DC

## 2015-08-21 MED ORDER — LORAZEPAM 1 MG PO TABS: 1.0000 mg | ORAL_TABLET | Freq: Three times a day (TID) | ORAL | Status: DC

## 2015-08-21 MED ORDER — HYDROXYZINE HCL 25 MG PO TABS: 25.0000 mg | ORAL_TABLET | Freq: Four times a day (QID) | ORAL | Status: DC | PRN

## 2015-08-21 MED ORDER — HYDROXYZINE HCL 25 MG PO TABS
25.0000 mg | ORAL_TABLET | Freq: Four times a day (QID) | ORAL | Status: DC | PRN
  Administered 2015-08-22: 25 mg via ORAL
  Filled 2015-08-21: qty 1

## 2015-08-21 MED ORDER — LORAZEPAM 1 MG PO TABS
1.0000 mg | ORAL_TABLET | Freq: Four times a day (QID) | ORAL | Status: DC
  Administered 2015-08-21 – 2015-08-22 (×3): 1 mg via ORAL
  Filled 2015-08-21 (×3): qty 1

## 2015-08-21 MED ORDER — THIAMINE HCL 100 MG/ML IJ SOLN: 100.0000 mg | Freq: Once | INTRAMUSCULAR | Status: DC

## 2015-08-21 MED ORDER — LAMOTRIGINE 25 MG PO TABS
25.0000 mg | ORAL_TABLET | Freq: Two times a day (BID) | ORAL | Status: DC
  Administered 2015-08-21: 25 mg via ORAL
  Filled 2015-08-21 (×2): qty 1

## 2015-08-21 MED ORDER — ENSURE ENLIVE PO LIQD: 237.0000 mL | Freq: Two times a day (BID) | ORAL | Status: DC

## 2015-08-21 MED ORDER — LORAZEPAM 1 MG PO TABS: 1.0000 mg | ORAL_TABLET | Freq: Every day | ORAL | Status: DC

## 2015-08-21 NOTE — BHH Counselor (Signed)
Contacted nurse , kelly for tele-psych cart. Cart is in pt room and is not working. Currently working on issue to try get cart. Call will place, but not work. Previous IT call submitted, IT has not shown up yet. Lamae Fosco K. Sherlon HandingHarris, LCAS-A, LPC-A, [facility]  Counselor 08/21/2015 9:01 AM

## 2015-08-21 NOTE — ED Provider Notes (Signed)
CSN: [id number]     Arrival date & time 08/21/15  4098 History   First MD Initiated Contact with Patient 08/21/15 [phone]     Chief Complaint  Patient presents with  . Suicidal     (Consider location/radiation/quality/duration/timing/severity/associated sxs/prior Treatment) HPI   Patient is a 45 year old male with a history of substance abuse, depression, hep C, bipolar, PTSD who presents to the ED via EMS with attempted suicide. Patient states he was going to jump off a bridge last night. He has had multiple suicide attempts in the past. Patient states he is having a difficult time with his girlfriend who was smoking weed last night with a bunch of other men. He states he smoked weed with her and feels as though the weed was laced with something. He states he went crazy after smoking the weed. Patient states he did drink alcohol last evening. Patient states he's been clean off heroin for 1 week using Suboxone. He states that if he relapses on heroin he will kill himself. Patient states he fell down a embankment that was roughly 30 yards. He complains of constant lumbar back pain is worse with movement and taking a deep breath. He denies hitting his head. He states he may have lost consciousness for less than 1 minute. He denies chest pain, shortness of breath, abdominal pain.   Past Medical History  Diagnosis Date  . Depression   . Substance abuse   . Diabetes mellitus without complication (HCC)   . Hepatitis C   . DDD (degenerative disc disease), cervical   . Bipolar 2 disorder (HCC)   . PTSD (post-traumatic stress disorder)    Past Surgical History  Procedure Laterality Date  . Cholecystectomy    . Back surgery     Family History  Problem Relation Age of Onset  . Drug abuse Father   . Schizophrenia Paternal Aunt    Social History  Substance Use Topics  . Smoking status: Current Every Day Smoker -- 2.50 packs/day    Types: Cigarettes  . Smokeless tobacco: None  . Alcohol Use:  Yes     Comment: etoh abuse    Review of Systems  Neurological: Negative for syncope, weakness and numbness.  Psychiatric/Behavioral: Positive for suicidal ideas and self-injury. Negative for confusion.      Allergies  Seroquel; Ketorolac tromethamine; Morphine and related; and Tylenol  Home Medications   Prior to Admission medications   Medication Sig Start Date End Date Taking? Authorizing Provider  buprenorphine-naloxone (SUBOXONE) 8-2 MG SUBL SL tablet Place 1 tablet under the tongue 2 (two) times daily.   Yes Historical Provider, MD  hydrOXYzine (ATARAX/VISTARIL) 25 MG tablet Take 1 tablet (25 mg total) by mouth every 6 (six) hours as needed for anxiety. 08/14/15  Yes Adonis Brook, NP  lamoTRIgine (LAMICTAL) 25 MG tablet Take 1 tablet (25 mg total) by mouth 2 (two) times daily. 08/14/15  Yes Adonis Brook, NP  metFORMIN (GLUCOPHAGE) 1000 MG tablet Take 1 tablet (1,000 mg total) by mouth 2 (two) times daily with a meal. 08/14/15  Yes Adonis Brook, NP  pantoprazole (PROTONIX) 40 MG tablet Take 1 tablet (40 mg total) by mouth daily. Patient taking differently: Take 40 mg by mouth daily as needed.  08/14/15  Yes Adonis Brook, NP  benztropine (COGENTIN) 0.5 MG tablet Take 1 tablet (0.5 mg total) by mouth 2 (two) times daily. Patient not taking: Reported on 08/21/2015 08/14/15   Adonis Brook, NP  cloNIDine (CATAPRES) 0.1 MG tablet Take 1  tablet (0.1 mg total) by mouth daily before breakfast. Patient not taking: Reported on 08/21/2015 08/14/15   Adonis BrookSheila Agustin, NP  haloperidol (HALDOL) 1 MG tablet Take 1 tablet (1 mg total) by mouth 2 (two) times daily. Patient not taking: Reported on 08/21/2015 08/14/15   Adonis BrookSheila Agustin, NP  nicotine (NICODERM CQ - DOSED IN MG/24 HOURS) 21 mg/24hr patch Place 1 patch (21 mg total) onto the skin daily. Patient not taking: Reported on 08/21/2015 08/14/15   Adonis BrookSheila Agustin, NP   BP 122/76 mmHg  Pulse 90  Temp(Src) 98.7 F (37.1 C) (Oral)  Resp 19  Ht 6'  2" (1.88 m)  Wt 127.007 kg  BMI 35.93 kg/m2  SpO2 94% Physical Exam  Constitutional: He appears well-developed and well-nourished. No distress.  Tearful on exam  HENT:  Head: Normocephalic and atraumatic.  Eyes: Right eye exhibits no discharge and no exudate. Left eye exhibits no discharge and no exudate. Right conjunctiva is injected. Left conjunctiva is injected.  Cardiovascular: Normal rate, regular rhythm and normal heart sounds.   Pulmonary/Chest: Effort normal and breath sounds normal. No respiratory distress. He has no wheezes. He has no rales.  Musculoskeletal:  Examination lumbar spine revealed no step-offs or deformities, previous incisional scar noted to roughly L1 through L3, TTP 2 lumbar spine and paraspinal muscles, no TTP 2 thoracic or cervical spine. Good AROM of thoracic and cervical spine, limited AROM of the lumbar spine due to pain. Strength 5/5 of bilateral lower extremities including plantar flexion and extension. Sensation intact to bilateral lower extremities.  Neurological: He is alert. Coordination normal.  Skin: He is not diaphoretic.  Psychiatric: His speech is normal. He is withdrawn. He exhibits a depressed mood. He expresses suicidal ideation. He expresses no homicidal ideation. He expresses suicidal plans.    ED Course  Procedures (including critical care time) Labs Review Labs Reviewed  COMPREHENSIVE METABOLIC PANEL - Abnormal; Notable for the following:    Sodium 133 (*)    CO2 21 (*)    Glucose, Bld 179 (*)    AST 117 (*)    ALT 121 (*)    All other components within normal limits  ETHANOL - Abnormal; Notable for the following:    Alcohol, Ethyl (B) 75 (*)    All other components within normal limits  ACETAMINOPHEN LEVEL - Abnormal; Notable for the following:    Acetaminophen (Tylenol), Serum <10 (*)    All other components within normal limits  CBC - Abnormal; Notable for the following:    WBC 14.1 (*)    HCT 38.5 (*)    All other components  within normal limits  URINE RAPID DRUG SCREEN, HOSP PERFORMED - Abnormal; Notable for the following:    Cocaine POSITIVE (*)    All other components within normal limits  SALICYLATE LEVEL    Imaging Review Dg Lumbar Spine Complete  08/21/2015  CLINICAL DATA:  Post fall down embankment tonight with new mid low back pain. History of prior back surgery in 2004 EXAM: LUMBAR SPINE - COMPLETE 4+ VIEW COMPARISON:  07/09/2015; CT abdomen pelvis - 05/21/2015 FINDINGS: There are 5 non rib-bearing lumbar type vertebral bodies. Normal alignment of the lumbar spine. No anterolisthesis or retrolisthesis. No definite pars defects. Post L5-S1 paraspinal fusion without definite evidence of hardware failure or loosening. Lumbar vertebral body heights are preserved. There is mild multilevel lumbar spine DDD, likely worse at L2-L3 and L3-L4 with disc space height loss, endplate irregularity and sclerosis. Limited visualization of the bilateral  SI joints is normal. Suspected mild degenerative change of the bilateral hips, right greater than left with joint space loss, subchondral sclerosis and osteophytosis, incompletely evaluated. Atherosclerotic plaque within the abdominal aorta. Post cholecystectomy. Several phleboliths overlie the lower pelvis bilaterally, left greater than right. IMPRESSION: 1. No acute findings. 2. Similar appearance of prior L5-S1 paraspinal fusion without definite evidence of hardware failure loosening. 3. Mild multilevel lumbar spine DDD. Electronically Signed   By: Simonne Come M.D.   On: 08/21/2015 07:37   I have personally reviewed and evaluated these images and lab results as part of my medical decision-making.   EKG Interpretation None      MDM   Final diagnoses:  Suicidal intent   Patient is a 45 year old male with a history of substance abuse, depression, hep C, bipolar, PTSD who presents to the ED via EMS with attempted suicide. No acute abnormalities seen on lumbar x-ray. Low back  pain likely 2/2 chronic low back pain and soft tissue injury from fall last night likely a strain or sprain. Patient is medically cleared at this time.     Jerre Simon, PA 08/21/15 1522  Shon Baton, MD 08/22/15 1535

## 2015-08-21 NOTE — BHH Counselor (Signed)
[name], NP pt meets criteria, recommend 300, 400 hall placement. Gary Boone K. Sherlon HandingHarris, LCAS-A, LPC-A, [facility]  Counselor 08/21/2015 10:18 AM
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
## 2015-08-21 NOTE — Progress Notes (Signed)
Gary CaoJeremy Brunn is a 45 y.o. male voluntarily admitted for SI with a plan to jump off a bridge, and alcohol abuse after [facility].  States he has been drinking for 20 years, most recently has been drinking a fifth of liquor daily.  His last use was "a 16 pack of beer 24 hours ago."   He said at the time he smoked some marijuana "that must have been laced with something."  Is on suboxone "for the past week" due to past history of heroine abuse.  Reports recent auditory and visual hallucinations but would not disclose details, denies command hallucinations.  Psychiatric history includes bipolar type 2 and PTSD per chart.  Patient states "I also have ADD."  Medical and surgical history reviewed and noted below, pertinent history includes diabetes type 2, DDD with multiple surgeries in the back (x8 between 2001 and 2009), and Hepatitis C. Basic search of patient completed with skin check, scar observed on lower back (from past surgery) with multiple various tattoos on the body.  Belongings reviewed and noted on belongings record.  Oriented to unit and rules, consents and treatment agreement reviewed with patient and signed.  Allergies  Allergen Reactions  . Seroquel [Quetiapine Fumarate] Swelling and Anaphylaxis    Tongue swells  . Ketorolac Tromethamine Rash  . Morphine And Related Itching and Rash    Other reaction(s): Unknown Pt says he is not allergic.  . Tylenol [Acetaminophen]     Liver problems   Past Medical History  Diagnosis Date  . Depression   . Substance abuse   . Diabetes mellitus without complication (HCC)   . Hepatitis C   . DDD (degenerative disc disease), cervical   . Bipolar 2 disorder (HCC)   . PTSD (post-traumatic stress disorder)    Past Surgical History  Procedure Laterality Date  . Cholecystectomy    . Back surgery

## 2015-08-21 NOTE — Progress Notes (Signed)
Adult Psychoeducational Group Note  Date:  08/21/2015 Time:  8:20 PM  Group Topic/Focus:  NA Group  Participation Level:  Did Not Attend  Participation Quality:  Did not attend  Affect:  Did not attend  Cognitive:  Did not attend  Insight: None  Engagement in Group:  Did not attend  Modes of Intervention:  Did not attend  Additional Comments:  Patient remained in bed awake during group.   Lyndee HensenGoins, Terianna Peggs R 08/21/2015, 8:20 PM

## 2015-08-21 NOTE — Progress Notes (Signed)
Patient ID: Barbaraann CaoJeremy Ehmann, male   DOB: 11/06/1970, 45 y.o.   MRN: [id number] PER STATE REGULATIONS 482.30  THIS CHART WAS REVIEWED FOR MEDICAL NECESSITY WITH RESPECT TO THE PATIENT'S ADMISSION/DURATION OF STAY.  NEXT REVIEW DATE:08/25/15  Loura HaltBARBARA Riordan Walle, RN, BSN CASE MANAGER

## 2015-08-21 NOTE — ED Notes (Signed)
Per EMS [place] [address] [place] [address]  Pt states he was looking for his girlfriend  Pt states he has smoked some pot and split a 1418 pk with his friend   Pt states he got depressed and walked to the bridge and was thinking of jumping but changed his mind because he got scared so he walked to the gas station and they called 911  EMS found pt standing outside

## 2015-08-22 ENCOUNTER — Encounter (HOSPITAL_COMMUNITY): Payer: Self-pay | Admitting: Psychiatry

## 2015-08-22 DIAGNOSIS — Z8659 Personal history of other mental and behavioral disorders: Secondary | ICD-10-CM

## 2015-08-22 DIAGNOSIS — F1420 Cocaine dependence, uncomplicated: Secondary | ICD-10-CM | POA: Diagnosis present

## 2015-08-22 DIAGNOSIS — F4310 Post-traumatic stress disorder, unspecified: Secondary | ICD-10-CM | POA: Clinically undetermined

## 2015-08-22 LAB — GLUCOSE, CAPILLARY
GLUCOSE-CAPILLARY: 109 mg/dL — AB (ref 65–99)
GLUCOSE-CAPILLARY: 133 mg/dL — AB (ref 65–99)

## 2015-08-22 MED ORDER — BENZTROPINE MESYLATE 0.5 MG PO TABS: 0.5000 mg | ORAL_TABLET | Freq: Two times a day (BID) | ORAL | Status: AC

## 2015-08-22 MED ORDER — LAMOTRIGINE 25 MG PO TABS: 25.0000 mg | ORAL_TABLET | Freq: Two times a day (BID) | ORAL | Status: AC

## 2015-08-22 MED ORDER — PANTOPRAZOLE SODIUM 40 MG PO TBEC: 40.0000 mg | DELAYED_RELEASE_TABLET | Freq: Every day | ORAL | Status: AC

## 2015-08-22 MED ORDER — HYDROXYZINE HCL 25 MG PO TABS: 25.0000 mg | ORAL_TABLET | Freq: Four times a day (QID) | ORAL | Status: AC | PRN

## 2015-08-22 MED ORDER — BENZTROPINE MESYLATE 0.5 MG PO TABS
0.5000 mg | ORAL_TABLET | Freq: Two times a day (BID) | ORAL | Status: DC
  Filled 2015-08-22 (×4): qty 1

## 2015-08-22 MED ORDER — HALOPERIDOL 1 MG PO TABS: 1.0000 mg | ORAL_TABLET | Freq: Two times a day (BID) | ORAL | Status: AC

## 2015-08-22 MED ORDER — NICOTINE 21 MG/24HR TD PT24: 21.0000 mg | MEDICATED_PATCH | Freq: Every day | TRANSDERMAL | Status: AC

## 2015-08-22 MED ORDER — ENSURE ENLIVE PO LIQD: 237.0000 mL | Freq: Every day | ORAL | Status: DC | PRN

## 2015-08-22 MED ORDER — METFORMIN HCL 1000 MG PO TABS: 1000.0000 mg | ORAL_TABLET | Freq: Two times a day (BID) | ORAL | Status: AC

## 2015-08-22 MED ORDER — METHOCARBAMOL 500 MG PO TABS: 500.0000 mg | ORAL_TABLET | Freq: Three times a day (TID) | ORAL | Status: DC | PRN

## 2015-08-22 MED ORDER — HALOPERIDOL 1 MG PO TABS
1.0000 mg | ORAL_TABLET | Freq: Two times a day (BID) | ORAL | Status: DC
  Filled 2015-08-22 (×4): qty 1

## 2015-08-22 MED ORDER — DICYCLOMINE HCL 20 MG PO TABS
20.0000 mg | ORAL_TABLET | Freq: Four times a day (QID) | ORAL | Status: DC | PRN
  Administered 2015-08-22: 20 mg via ORAL
  Filled 2015-08-22: qty 1

## 2015-08-22 MED ORDER — FLUOXETINE HCL 20 MG PO CAPS
20.0000 mg | ORAL_CAPSULE | Freq: Every day | ORAL | Status: DC
  Administered 2015-08-22: 20 mg via ORAL
  Filled 2015-08-22 (×4): qty 1

## 2015-08-22 MED ORDER — IBUPROFEN 600 MG PO TABS
600.0000 mg | ORAL_TABLET | Freq: Four times a day (QID) | ORAL | Status: DC | PRN
  Administered 2015-08-22: 600 mg via ORAL
  Filled 2015-08-22: qty 1

## 2015-08-22 MED ORDER — ZOLPIDEM TARTRATE 5 MG PO TABS: 5.0000 mg | ORAL_TABLET | Freq: Every day | ORAL | Status: DC

## 2015-08-22 MED ORDER — FLUOXETINE HCL 20 MG PO CAPS
20.0000 mg | ORAL_CAPSULE | Freq: Every day | ORAL | Status: AC
Start: 2015-08-22 — End: ?

## 2015-08-22 MED ORDER — CYANOCOBALAMIN 1000 MCG/ML IJ SOLN
1000.0000 ug | Freq: Once | INTRAMUSCULAR | Status: AC
  Administered 2015-08-22: 1000 ug via INTRAMUSCULAR
  Filled 2015-08-22: qty 1

## 2015-08-22 NOTE — [facility] Group Notes (Signed)
[facility] LCSW Group Therapy  08/22/2015 10:42 AM  Type of Therapy:  Group Therapy  Participation Level:  Did Not Attend ~ was in and out of group room but never stayed  Summary of Progress/Problems:  Finding Balance in Life. Today's group focused on defining balance in one's own words, identifying things that can knock one off balance, and exploring healthy ways to maintain balance in life. Group members were asked to provide an example of a time when they felt off balance, describe how they handled that situation,and process healthier ways to regain balance in the future. Group members were asked to share the most important tool for maintaining balance that they learned [facility] and how they plan to apply this method after discharge.   Gary Bernatherine C Harrill, LCSW

## 2015-08-22 NOTE — Progress Notes (Signed)
D). Patient has been appropriate and bright of affect for the first part of shift. Patient reports he can, "compartmentalize", he feelings and be positive and bright with others, even when he feels sad or angry. On his self assessment he reports he does feel suicidal, and that depression and hopelessness are 10/10 and his anxiety is 8/10Denies HI/AVH. In the early afternoon patients behavior and affect became very agitated, and verbally began escalating. Patient remarking often that, "You aren't even giving me adequate detox. You sent me to the Suboxone clinic and now you won't help me detox properly". Patient became more and more agitated as the afternoon progressed. P). Emotional support and encouragement offered. Education provided on medication, indications and side effect. Patient spoke with the NP and the MD. who agreed to discharge him, after speaking with him and him denying SI and being able to contract for safety.Marland Kitchen. R). Continue to maintain safety on the unit. Continue to take medications.s as prescribed.  Patient denies SI/HI/AVH. Pt. discharged to lobby.  Belongings sheet reviewed and signed by pt. and all belongings sent home. Paperwork reviewed and pt. able to verbalize understanding of education. Pt. in no current distress and ambulatory.
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
## 2015-08-22 NOTE — BHH Suicide Risk Assessment (Signed)
[facility] Discharge Suicide Risk Assessment   Principal Problem: PTSD (post-traumatic stress disorder) Discharge Diagnoses:  Patient Active Problem List   Diagnosis Date Noted  . PTSD (post-traumatic stress disorder) [F43.10] 08/22/2015  . Cocaine use disorder, moderate, dependence ([place]) [F14.20] 08/22/2015  . 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 History of ADHD [Z86.59] 08/22/2015  . Alcohol use disorder, severe, dependence ([place]) [F10.20] 08/21/2015  . Opioid use disorder, severe, dependence ([place]) [F11.20] 08/09/2015  . 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 History of spinal surgery [Z98.890] 08/09/2015    Total Time spent with patient: 30 minutes  Musculoskeletal: Strength & Muscle Tone: within normal limits Gait & Station: normal Patient leans: N/A  Psychiatric Specialty Exam: Review of Systems  Psychiatric/Behavioral: Positive for depression and substance abuse. Negative for suicidal ideas.  All other systems reviewed and are negative.   Blood pressure 143/81, pulse 84, temperature 98.6 F (37 C), temperature source Oral, resp. rate 20, height 6' 2"  (1.88 m), weight 113.399 kg (250 lb).Body mass index is 32.08 kg/(m^2).  General Appearance: Casual  Eye Contact::  Fair  Speech:  Normal Rate409  Volume:  Normal  Mood:  Anxious  Affect:  Congruent  Thought Process:  Goal Directed  Orientation:  Full (Time, Place, and Person)  Thought Content:  Logical  Suicidal Thoughts:  No  Homicidal Thoughts:  No  Memory:  Immediate;   Fair Recent;   Fair Remote;   Fair  Judgement:  Impaired  Insight:  Shallow  Psychomotor Activity:  Normal  Concentration:  Fair  Recall:  [employer] of Knowledge:Fair  Language: Fair  Akathisia:  No  Handed:  Right  AIMS (if indicated):     Assets:  Communication Skills Desire for Improvement  Sleep:  Number of Hours: 6.75  Cognition: WNL  ADL's:   Intact   Mental Status Per Nursing Assessment::   On Admission:     Demographic Factors:  Male and Caucasian  Loss Factors: NA  Historical Factors: Impulsivity  Risk Reduction Factors:  Patient denies SI , denies family hx of SI - has access to treatment - reports he wants to get back on his suboxone.   Continued Clinical Symptoms:  Alcohol/Substance Abuse/Dependencies  Cognitive Features That Contribute To Risk:  None    Suicide Risk:  Minimal: No identifiable suicidal ideation.  Patients presenting with no risk factors but with morbid ruminations; may be classified as minimal risk based on the severity of the depressive symptoms  Follow-up Information    Follow up with [facility].   Why:  Referral faxed 08/22/15   Contact information:   [address] [place] [address] Phone: [phone] Fax: [phone]      Plan Of Care/Follow-up recommendations: Patient this afternoon wanted to speak to a provider again- wanted to get back on suboxone , when NP told him we can refer him to a clinic that does that, pt became upset , reported he wanted to be discharged. Writer met with patient and discussed treatment plan, alternative medications for withdrawal sx as well as anxiety sx. Pt reported he was not interested. Pt reported he was not suicidal and wanted to get back to his previous halfway house since he had given away his money to them. Pt at this time does not want to get treatment here . Will DC patient at this time. Activity:  no restrictions Diet:  carb modified Tests:  as needed Other:  follow up with aftercare  [name], MD 08/22/2015, 3:41 PM
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
## 2015-08-22 NOTE — Progress Notes (Signed)
NUTRITION ASSESSMENT  Pt identified as at risk on the Malnutrition Screen Tool  INTERVENTION: 1. Educated patient on the importance of nutrition and encouraged intake of food and beverages. 2. Discussed weight goals. 3. Supplements: continue Ensure Enlive but will change to once/day PRN; this supplement provides 350 kcal, 20 grams of protein.  NUTRITION DIAGNOSIS: Inadequate protein-energy intake related to polysubstance abuse, SI as evidenced by pt report.  Goal: Pt to meet >/= 90% of their estimated nutrition needs.  Monitor:  PO intake  Assessment:  Pt screened for MST. He was admitted with SI, polysubstance abuse; he has had drug abuse since age 45 and has been to rehabilitation centers many times in the past. Per review, weight has been trending up this month.   45 y.o. male  Height: Ht Readings from Last 1 Encounters:  08/21/15 6\' 2"  (1.88 m)    Weight: Wt Readings from Last 1 Encounters:  08/21/15 250 lb (113.399 kg)    Weight Hx: Wt Readings from Last 10 Encounters:  08/21/15 250 lb (113.399 kg)  08/21/15 280 lb (127.007 kg)  08/09/15 240 lb (108.863 kg)    BMI:  Body mass index is 32.08 kg/(m^2). Pt meets criteria for obesity based on current BMI.  Estimated Nutritional Needs: Kcal: 25-30 kcal/kg Protein: > 1 gram protein/kg Fluid: 1 ml/kcal  Diet Order: Diet regular Room service appropriate?: Yes; Fluid consistency:: Thin Pt is also offered choice of unit snacks mid-morning and mid-afternoon.  Pt is eating as desired.   Lab results and medications reviewed.      Trenton GammonJessica Dajai Wahlert, RD, LDN Inpatient Clinical Dietitian Pager # [phone] After hours/weekend pager # [phone]

## 2015-08-22 NOTE — Tx Team (Addendum)
Interdisciplinary Treatment Plan Update (Adult)  Date:  08/22/2015  Time Reviewed:  8:36 AM   Progress in Treatment: Attending groups: No. New to unit. Continuing to assess.  Participating in groups:  No. Taking medication as prescribed:  Yes. Tolerating medication:  Yes. Family/Significant othe contact made:  SPE required for this pt.  Patient understands diagnosis:  Yes. and As evidenced by:  seeking treatment for SI, depression, alcohol abuse, and for medication stabilization. Discussing patient identified problems/goals with staff:  Yes. Medical problems stabilized or resolved:  Yes. Denies suicidal/homicidal ideation: Passive SI/Able to contract for safety on the unit.  Issues/concerns per patient self-inventory:  Other:  Discharge Plan or Barriers: CSW assessing for appropriate referrals.   Reason for Continuation of Hospitalization: Depression Medication stabilization Suicidal ideation Withdrawal symptoms  Comments:  Gary Boone is an 45 y.o. male, Caucasian, Single who presents to [facility] ED with complaints of active SI with plan to jump in front of traffic., and relapse from Alcohol. Patient primary concern is recent relapse, and continuation of Suboxone clinic [per pt. Has appt with [facility] with Dr. Velna Hatchet. Pt was l;ast seen in [facility] on 08-09-15 and stated he did not follow -up with [facility] or [facility]. patient states he has recently contacted [facility] as part of plan for admittance for recovery treatment placement. Patient states that he has not slept in x 3 days, and his normal sleep is from 4-6 hours nightly. Patient states was staying with girlfriend in [place], but is not able to return due to girlfriend S.A. And relationship issues. Patient acknowledges current SI with plan to jump in front of traffic, and with hx. Of SI multiple attempts via slitting wrist in past [last attempt time frame unspecified]. Patient denies HI  or hx. Of HI. patient acknowledges history of AVH [unspecified if induced by substance abuse]. Patient has been seen inpatient for psychiatric care multiple times in 2016/17 with most recent in 08-09-15 for SI and Substance abuse. Patient states he has not been seen outpatient but has appointmnet with [facility] [place], and plans to follow up with [facility]. Diagnosis: Alcohol Use Disorder, Severe; Post Traumatic Stress Disorder  Estimated length of stay:  3-5 days  New goal(s): to develop effective aftercare plan.   Additional Comments:  Patient and CSW reviewed pt's identified goals and treatment plan. Patient verbalized understanding and agreed to treatment plan. CSW reviewed [facility] "Discharge Process and Patient Involvement" Form. Pt verbalized understanding of information provided and signed form.    Review of initial/current patient goals per problem list:  1. Goal(s): Patient will participate in aftercare plan  Met: Yes  Target date: at discharge  As evidenced by: Patient will participate within aftercare plan AEB aftercare provider and housing plan at discharge being identified.  5/25: CSW assessing for appropriate referrals. 08/23/2015 8:28 AM [facility] referral made.   2. Goal (s): Patient will exhibit decreased depressive symptoms and suicidal ideations.  Met: Adequate for d/c per MD.    Target date: at discharge  As evidenced by: Patient will utilize self rating of depression at 3 or below and demonstrate decreased signs of depression or be deemed stable for discharge by MD.  5/25: Pt rates depression as high with passive SI/able to contract for safety on the unit.   5/26: Pt rates depression as "normal" prior to d/c.   3. Goal(s): Patient will demonstrate decreased signs of withdrawal due to substance abuse  JAS:[id number] for d/c  Target date:at discharge   As evidenced by: Patient will produce a CIWA/COWS score of 0, have stable vitals signs, and  no symptoms of withdrawal.  5/25: Pt reports mild withdrawals with CIWA of 3/COWS of 1 and stable vitals. Adequate for d/c.   Attendees: Patient:   08/22/2015 8:36 AM   Family:   08/22/2015 8:36 AM   Physician:  Dr. Cristy Friedlander MD  08/22/2015 8:36 AM   Nursing:   Fayrene Fearing RN 08/22/2015 8:36 AM   Clinical Social Worker: Maxie Better, LCSW 08/22/2015 8:36 AM   Clinical Social Worker: Erasmo Downer Drinkard LCSW; Peri Maris LCSWA 08/22/2015 8:36 AM   Other:  Gerline Legacy Nurse Case Manager 08/22/2015 8:36 AM   Other:  Agustina Caroli NP  08/22/2015 8:36 AM   Other:   08/22/2015 8:36 AM   Other:  08/22/2015 8:36 AM   Other:  08/22/2015 8:36 AM   Other:  08/22/2015 8:36 AM    08/22/2015 8:36 AM    08/22/2015 8:36 AM    08/22/2015 8:36 AM    08/22/2015 8:36 AM    Scribe for Treatment Team:   Maxie Better, LCSW 08/22/2015 8:36 AM
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
## 2015-08-22 NOTE — Progress Notes (Signed)
Gary Boone states his goal was to "make it through the night". He rates Anxiety and Depression 10/10. Endorses passive SI however he contracts for safety. He endorses AH with voices telling him he is worthless. Denies SI/HI/VH. Encouragement and support given. Medications administered as prescribed. Continue Q 15 minute checks for patient safety and medication effectiveness.
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
## 2015-08-23 NOTE — Progress Notes (Signed)
  [facility] Discharge Plan :  Will you be returning to the same living situation after discharge:  Yes,  home At discharge, do you have transportation home?: Yes,  friend Do you have the ability to pay for your medications: Yes,  medicare  Release of information consent forms completed and submitted to medical records by CSW.  Patient to Follow up at: Follow-up Information    Follow up with [facility].   Why:  Referral faxed 08/22/15. Patient discharged per his request and will follow-up on his own.   Contact information:   [address] [place], [place] [address] Phone: [phone] Fax: [phone]      Follow [facility] with [facility].   Why:  Walk in between 8am-9am Monday through Friday for hospital follow-up.    Contact information:   [address] [place], [place] [address] Phone: [phone] Fax: [phone]      Next level of care provider has access to [facility] Link:no  Safety Planning and Suicide Prevention discussed: Yes,  SPE completed with pt as he did not give permission for CSW to contact family  Have you used any form of tobacco in the last 30 days? (Cigarettes, Smokeless Tobacco, Cigars, and/or Pipes): Yes  Has patient been referred to the Quitline?: Patient refused referral  Patient has been referred for addiction treatment: Yes  Smart, Kellis Topete LCSW 08/23/2015, 8:24 AM

## 2015-08-23 NOTE — [facility] Suicide Risk Assessment (Signed)
[facility] INPATIENT:  Family/Significant Other Suicide Prevention Education  Suicide Prevention Education:  Patient Refusal for Family/Significant Other Suicide Prevention Education: The patient Gary Boone has refused to provide written consent for family/significant other to be provided Family/Significant Other Suicide Prevention Education during admission and/or prior to discharge.  Physician notified.  SPE completed with pt, as pt refused to consent to family contact. SPI pamphlet provided to pt and pt was encouraged to share information with support network, ask questions, and talk about any concerns relating to SPE. Pt denies access to guns/firearms and verbalized understanding of information provided. Mobile Crisis information also provided to pt.   Smart, Marchelle Rinella LCSW 08/23/2015, 8:23 AM

## 2015-08-27 MED FILL — SUBOXONE 8 MG-2 MG SL FILM: 8-2 | 7 days supply | Qty: 18 | Fill #0

## 2015-08-30 ENCOUNTER — Inpatient Hospital Stay
Admit: 2015-08-30 | Discharge: 2015-08-31 | Disposition: A | Discharge status: Home / Self Care | Payer: MEDICARE | Attending: Emergency Medicine

## 2015-08-30 DIAGNOSIS — F1010 Alcohol abuse, uncomplicated: Secondary | ICD-10-CM

## 2015-08-30 NOTE — ED Triage Notes (Signed)
[facility]. [facility] to [facility] for ETOH and substance abuse.

## 2015-08-31 LAB — METABOLIC PANEL, COMPREHENSIVE
A-G Ratio: 0.9 — ABNORMAL LOW (ref 1.2–3.5)
ALT (SGPT): 131 U/L — ABNORMAL HIGH (ref 12–65)
AST (SGOT): 100 U/L — ABNORMAL HIGH (ref 15–37)
Albumin: 3.4 g/dL — ABNORMAL LOW (ref 3.5–5.0)
Alk. phosphatase: 126 U/L (ref 50–136)
Anion gap: 10 mmol/L (ref 7–16)
BUN: 15 MG/DL (ref 6–23)
Bilirubin, total: 0.4 MG/DL (ref 0.2–1.1)
CO2: 26 mmol/L (ref 21–32)
Calcium: 8.2 MG/DL — ABNORMAL LOW (ref 8.3–10.4)
Chloride: 101 mmol/L (ref 98–107)
Creatinine: 1.09 MG/DL (ref 0.8–1.5)
GFR est AA: >60 mL/min/{1.73_m2} (ref >60)
GFR est non-AA: >60 mL/min/{1.73_m2} (ref >60)
Globulin: 3.6 g/dL — ABNORMAL HIGH (ref 2.3–3.5)
Glucose: 141 mg/dL — ABNORMAL HIGH (ref 65–100)
Potassium: 3.7 mmol/L (ref 3.5–5.1)
Protein, total: 7 g/dL (ref 6.3–8.2)
Sodium: 137 mmol/L (ref 136–145)

## 2015-08-31 LAB — CBC WITH AUTOMATED DIFF
ABS. BASOPHILS: 0 10*3/uL (ref 0.0–0.2)
ABS. EOSINOPHILS: 0.2 10*3/uL (ref 0.0–0.8)
ABS. IMM. GRANS.: 0 10*3/uL (ref 0.0–0.5)
ABS. LYMPHOCYTES: 4.8 10*3/uL — ABNORMAL HIGH (ref 0.5–4.6)
ABS. MONOCYTES: 0.8 10*3/uL (ref 0.1–1.3)
ABS. NEUTROPHILS: 4.3 10*3/uL (ref 1.7–8.2)
BASOPHILS: 0 % (ref 0.0–2.0)
EOSINOPHILS: 2 % (ref 0.5–7.8)
HCT: 38 % — ABNORMAL LOW (ref 41.1–50.3)
HGB: 12.9 g/dL — ABNORMAL LOW (ref 13.6–17.2)
IMMATURE GRANULOCYTES: 0.3 % (ref 0.0–5.0)
LYMPHOCYTES: 48 % — ABNORMAL HIGH (ref 13–44)
MCH: 30.9 PG (ref 26.1–32.9)
MCHC: 33.9 g/dL (ref 31.4–35.0)
MCV: 90.9 FL (ref 79.6–97.8)
MONOCYTES: 8 % (ref 4.0–12.0)
MPV: 10.2 FL — ABNORMAL LOW (ref 10.8–14.1)
NEUTROPHILS: 42 % — ABNORMAL LOW (ref 43–78)
PLATELET: 233 10*3/uL (ref 150–450)
RBC: 4.18 M/uL — ABNORMAL LOW (ref 4.23–5.67)
RDW: 13.4 % (ref 11.9–14.6)
WBC: 10.2 10*3/uL (ref 4.3–11.1)

## 2015-08-31 LAB — ACETAMINOPHEN: Acetaminophen level: 0 ug/mL — ABNORMAL LOW (ref 10.0–30.0)

## 2015-08-31 LAB — DRUG SCREEN, URINE
AMPHETAMINES: POSITIVE
BARBITURATES: NEGATIVE
BENZODIAZEPINES: NEGATIVE
COCAINE: NEGATIVE
METHADONE: NEGATIVE
OPIATES: NEGATIVE
PCP(PHENCYCLIDINE): NEGATIVE
THC (TH-CANNABINOL): NEGATIVE

## 2015-08-31 LAB — ETHYL ALCOHOL: ALCOHOL(ETHYL),SERUM: 140 MG/DL

## 2015-08-31 LAB — SALICYLATE: Salicylate level: 3.2 MG/DL — ABNORMAL LOW (ref 10–20)

## 2015-08-31 MED ORDER — THIAMINE 100 MG/ML INJECTION
100 mg/mL | Freq: Once | INTRAMUSCULAR | Status: AC
Start: 2015-08-31 — End: 2015-08-31
  Administered 2015-08-31: 01:00:00 via INTRAVENOUS

## 2015-08-31 MED FILL — SODIUM CHLORIDE 0.9 % IV: INTRAVENOUS | Qty: 1000

## 2015-08-31 NOTE — ED Notes (Signed)
(  LE) [place] from [place].  NAD noted at the present time

## 2015-08-31 NOTE — ED Notes (Signed)
Pt ambulatory to waiting room with steady gait.

## 2015-08-31 NOTE — ED Notes (Signed)
Pt in bed respirations even unlabored no signs of distress noted.

## 2015-08-31 NOTE — ED Notes (Signed)
No other changes at the present time.  Report given to Gabriel RungJoe, RN

## 2015-08-31 NOTE — ED Notes (Signed)
No other changes at the present time.  Appears to be sleeping without c/o discomfort

## 2015-08-31 NOTE — ED Notes (Signed)
I have reviewed discharge instructions with the patient.  The patient verbalized understanding.

## 2015-08-31 NOTE — ED Notes (Signed)
(  LE) [facility] called.  Unable to accept the pt until the morning due to staffing.  Pt appears to be resting quietly

## 2015-08-31 NOTE — ED Notes (Signed)
Spoke with Madelaine BhatAdam at [facility]. He states that pt was supposed to [facility] and detox from ETOH prior to admission to their program. [facility] does not do pt intake on weekends.  Pt given information to follow up [facility] and with [facility].

## 2015-08-31 NOTE — ED Provider Notes (Signed)
HPI Comments: Patient states he was sent here from [facility] he detoxed. He lives in [place] having had come down to enter their program.  Past he had had issues of heroin is on Suboxone.  He states he relapsed on alcohol today and had 3 drinks.oral medications for diabetes.  As stated self or other harming intent.  Denies using street drugs.  States it only had alcohol on this day and had not been drinking prior to this by report given to me.    Patient is a 45 y.o. male presenting with alcohol problem. The history is provided by the patient.   Alcohol Problem   There areno loss of consciousness, no seizures, no hallucinations, no violence and no intoxication present at this time.  This is a new problem. Suspected agents include alcohol. Pertinent negatives include no fever. Associated medical issues do not include mental illness.        Past Medical History:   Diagnosis Date   ??? CAD (coronary artery disease)    ??? Diabetes ([place])     Type 2   ??? Ill-defined condition     Degenerative Disc Disease   ??? Psychiatric disorder     Anxiety/depression/PTSD       Past Surgical History:   Procedure Laterality Date   ??? HX ORTHOPAEDIC      8 Back surgeries/L knee and R ankle         History reviewed. No pertinent family history.    Social History     Social History   ??? Marital status: UNKNOWN     Spouse name: N/A   ??? Number of children: N/A   ??? Years of education: N/A     Occupational History   ??? Not on file.     Social History Main Topics   ??? Smoking status: Current Every Day Smoker     Packs/day: 1.50   ??? Smokeless tobacco: Not on file   ??? Alcohol use Yes      Comment: 3 drinks today   ??? Drug use: Yes     Special: Heroin      Comment: On suboxone now   ??? Sexual activity: Not on file     Other Topics Concern   ??? Not on file     Social History Narrative   ??? No narrative on file         ALLERGIES: Review of patient's allergies indicates no known allergies.    Review of Systems    Constitutional: Negative for chills and fever.   HENT: Negative.    Respiratory: Negative.    Cardiovascular: Negative.    Gastrointestinal: Negative.    Genitourinary: Negative.    Musculoskeletal: Negative.    Neurological: Negative for seizures and loss of consciousness.   Psychiatric/Behavioral: Negative.  Negative for hallucinations. The patient is not nervous/anxious and is not hyperactive.    All other systems reviewed and are negative.      Vitals:    08/30/15 1940 08/30/15 2253 08/30/15 2256   BP: 110/58 91/53    Pulse: 83  75   Resp: 18     Temp: 98.1 ??F (36.7 ??C)     SpO2: 96%  93%   Weight: 72.6 kg (160 lb)     Height: 6' 2"  (1.88 m)              Physical Exam   Constitutional: He appears well-developed and well-nourished. No distress.   Sleeping most of the time  but is easy to awaken and will answer questions appropriately   HENT:   Head: Atraumatic.   Mouth/Throat: No oropharyngeal exudate.   Slightly dry mucous membranes   Eyes: No scleral icterus.   Neck: Neck supple.   Cardiovascular: Normal rate and intact distal pulses.    Pulmonary/Chest: Effort normal. No respiratory distress. He has no wheezes.   Abdominal: Soft. He exhibits no distension. There is no tenderness. There is no rebound and no guarding.   Musculoskeletal: Normal range of motion. He exhibits no tenderness.   Neurological: He is alert. He exhibits normal muscle tone. Coordination normal.   Skin: Skin is warm and dry.   Psychiatric: His behavior is normal. Thought content normal.   Nursing note and vitals reviewed.       MDM  Number of Diagnoses or Management Options  Alcohol abuse:   Positive urine drug screen:   Diagnosis management comments: Polysubstance abuse history and stable for outpatient detox. No present need for inpatient       Amount and/or Complexity of Data Reviewed  Clinical lab tests: ordered and reviewed  Decide to obtain previous medical records or to obtain history from someone other than the patient: yes (none)     Risk of Complications, Morbidity, and/or Mortality  Presenting problems: moderate  Diagnostic procedures: low  Management options: moderate  General comments: Stable patient    Patient Progress  Patient progress: stable    ED Course       Procedures           Recent Results (from the past 12 hour(s))   CBC WITH AUTOMATED DIFF    Collection Time: 08/30/15  9:25 PM   Result Value Ref Range    WBC 10.2 4.3 - 11.1 K/uL    RBC 4.18 (L) 4.23 - 5.67 M/uL    HGB 12.9 (L) 13.6 - 17.2 g/dL    HCT 38.0 (L) 41.1 - 50.3 %    MCV 90.9 79.6 - 97.8 FL    MCH 30.9 26.1 - 32.9 PG    MCHC 33.9 31.4 - 35.0 g/dL    RDW 13.4 11.9 - 14.6 %    PLATELET 233 150 - 450 K/uL    MPV 10.2 (L) 10.8 - 14.1 FL    DF AUTOMATED      NEUTROPHILS 42 (L) 43 - 78 %    LYMPHOCYTES 48 (H) 13 - 44 %    MONOCYTES 8 4.0 - 12.0 %    EOSINOPHILS 2 0.5 - 7.8 %    BASOPHILS 0 0.0 - 2.0 %    IMMATURE GRANULOCYTES 0.3 0.0 - 5.0 %    ABS. NEUTROPHILS 4.3 1.7 - 8.2 K/UL    ABS. LYMPHOCYTES 4.8 (H) 0.5 - 4.6 K/UL    ABS. MONOCYTES 0.8 0.1 - 1.3 K/UL    ABS. EOSINOPHILS 0.2 0.0 - 0.8 K/UL    ABS. BASOPHILS 0.0 0.0 - 0.2 K/UL    ABS. IMM. GRANS. 0.0 0.0 - 0.5 K/UL   METABOLIC PANEL, COMPREHENSIVE    Collection Time: 08/30/15  9:25 PM   Result Value Ref Range    Sodium 137 136 - 145 mmol/L    Potassium 3.7 3.5 - 5.1 mmol/L    Chloride 101 98 - 107 mmol/L    CO2 26 21 - 32 mmol/L    Anion gap 10 7 - 16 mmol/L    Glucose 141 (H) 65 - 100 mg/dL    BUN 15 6 - 23 MG/DL  Creatinine 1.09 0.8 - 1.5 MG/DL    GFR est AA >60 >60 ml/min/1.41m    GFR est non-AA >60 >60 ml/min/1.733m   Calcium 8.2 (L) 8.3 - 10.4 MG/DL    Bilirubin, total 0.4 0.2 - 1.1 MG/DL    ALT (SGPT) 131 (H) 12 - 65 U/L    AST (SGOT) 100 (H) 15 - 37 U/L    Alk. phosphatase 126 50 - 136 U/L    Protein, total 7.0 6.3 - 8.2 g/dL    Albumin 3.4 (L) 3.5 - 5.0 g/dL    Globulin 3.6 (H) 2.3 - 3.5 g/dL    A-G Ratio 0.9 (L) 1.2 - 3.5     ETHYL ALCOHOL    Collection Time: 08/30/15  9:25 PM   Result Value Ref Range     ALCOHOL(ETHYL),SERUM 14712G/DL   SALICYLATE    Collection Time: 08/30/15  9:25 PM   Result Value Ref Range    SALICYLATE 3.2 (L) 10 - 20 MG/DL   ACETAMINOPHEN    Collection Time: 08/30/15  9:25 PM   Result Value Ref Range    Acetaminophen level 0 (L) 10.0 - 30.0 ug/mL   DRUG SCREEN, URINE    Collection Time: 08/30/15  9:40 PM   Result Value Ref Range    PCP(PHENCYCLIDINE) NEGATIVE       BENZODIAZEPINE NEGATIVE       COCAINE NEGATIVE       AMPHETAMINE POSITIVE      METHADONE NEGATIVE       THC (TH-CANNABINOL) NEGATIVE       OPIATES NEGATIVE       BARBITURATES NEGATIVE

## 2016-09-10 IMAGING — CR DG LUMBAR SPINE COMPLETE 4+V
5 series · 5 of 5 positions shown · non-contrast
Comparison: 07/09/2015; CT abdomen pelvis - 05/21/2015

CLINICAL DATA: Post fall down embankment tonight with new mid low
back pain. History of prior back surgery in 3888

EXAM:
LUMBAR SPINE - COMPLETE 4+ VIEW

[t lumbar spine ap]
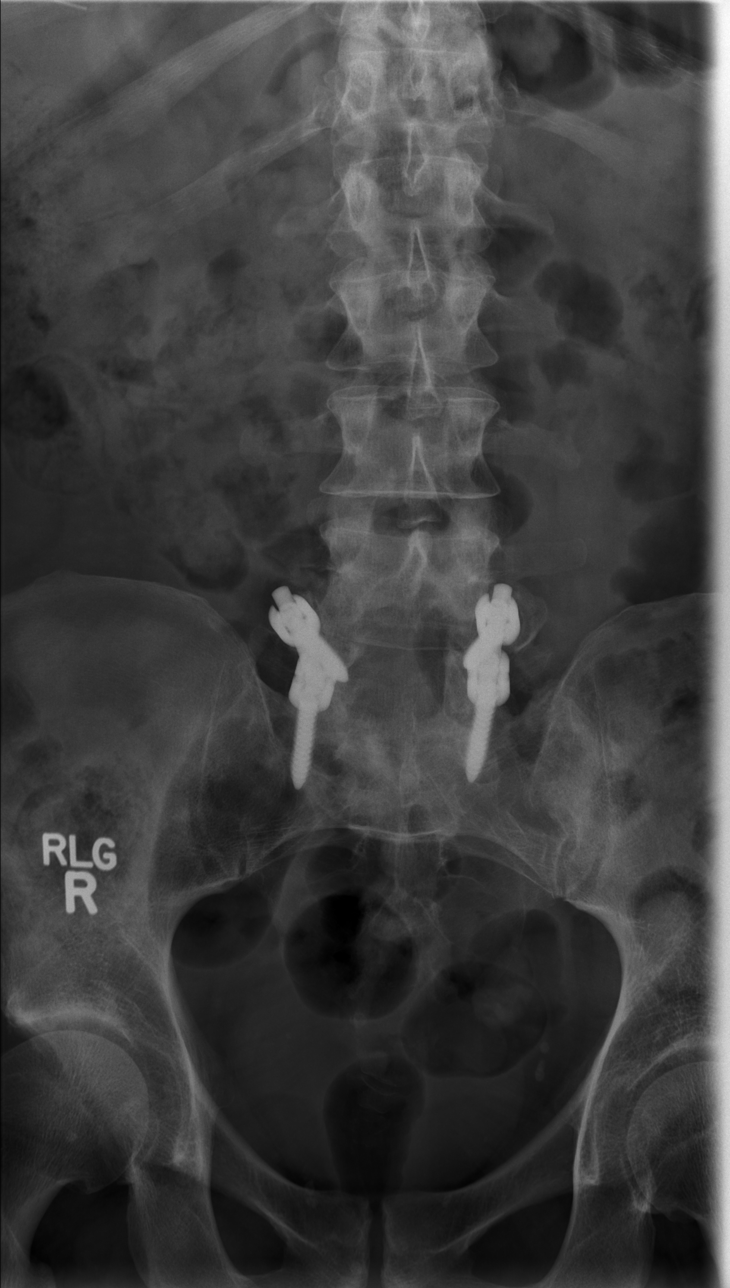

[t lumbar spine obl (1 of 2)]
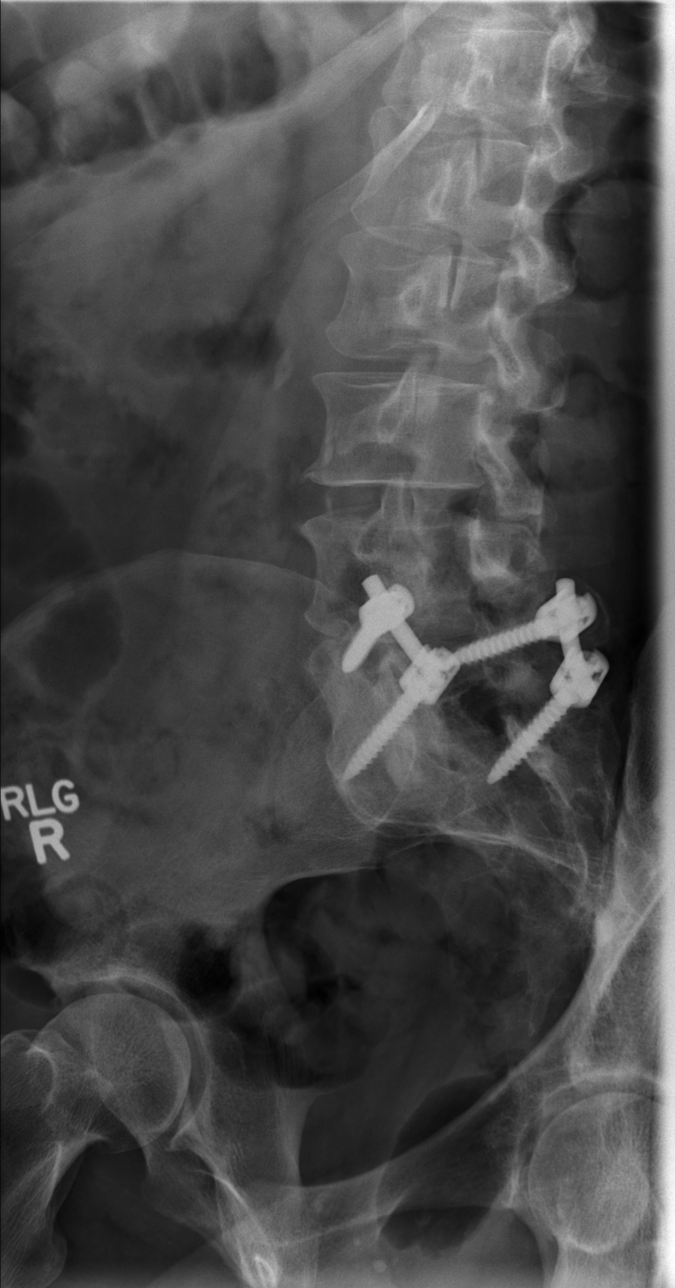

[t lumbar spine obl (2 of 2)]
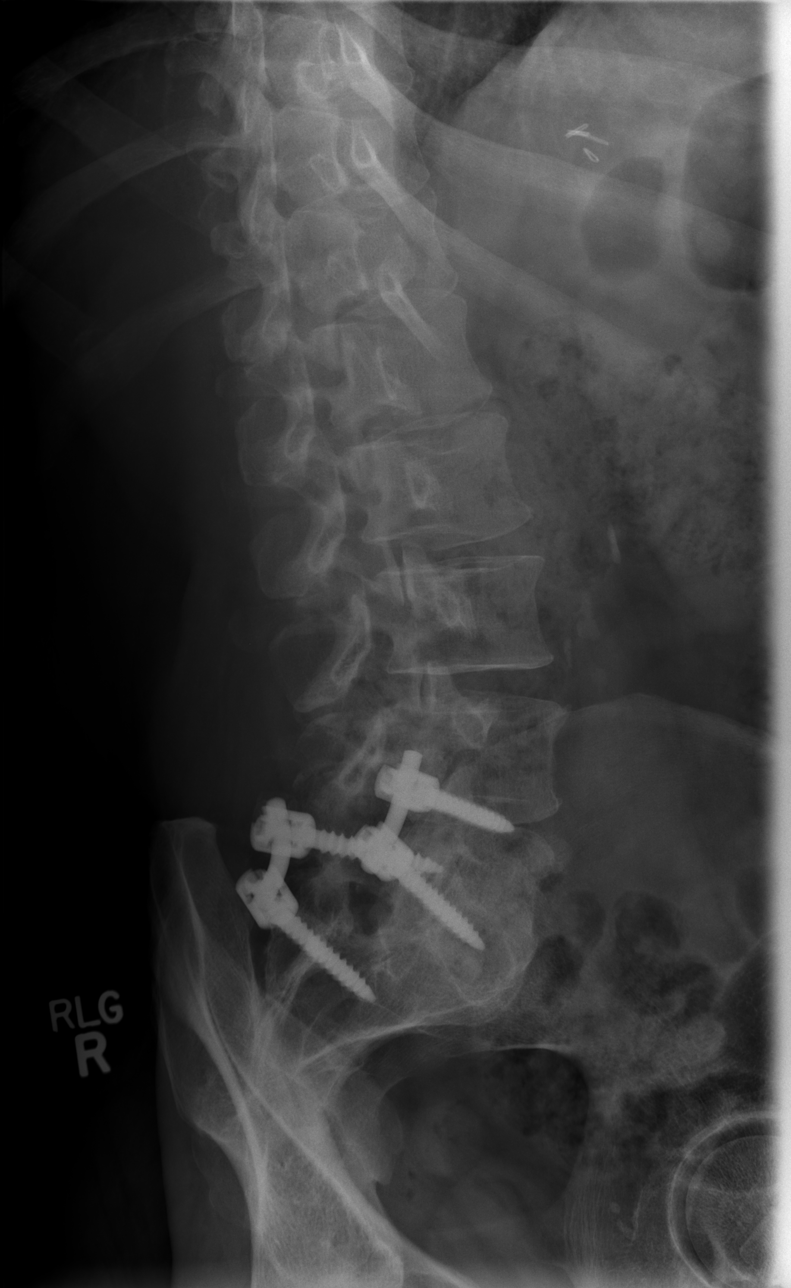

[t lumbar spine lat]
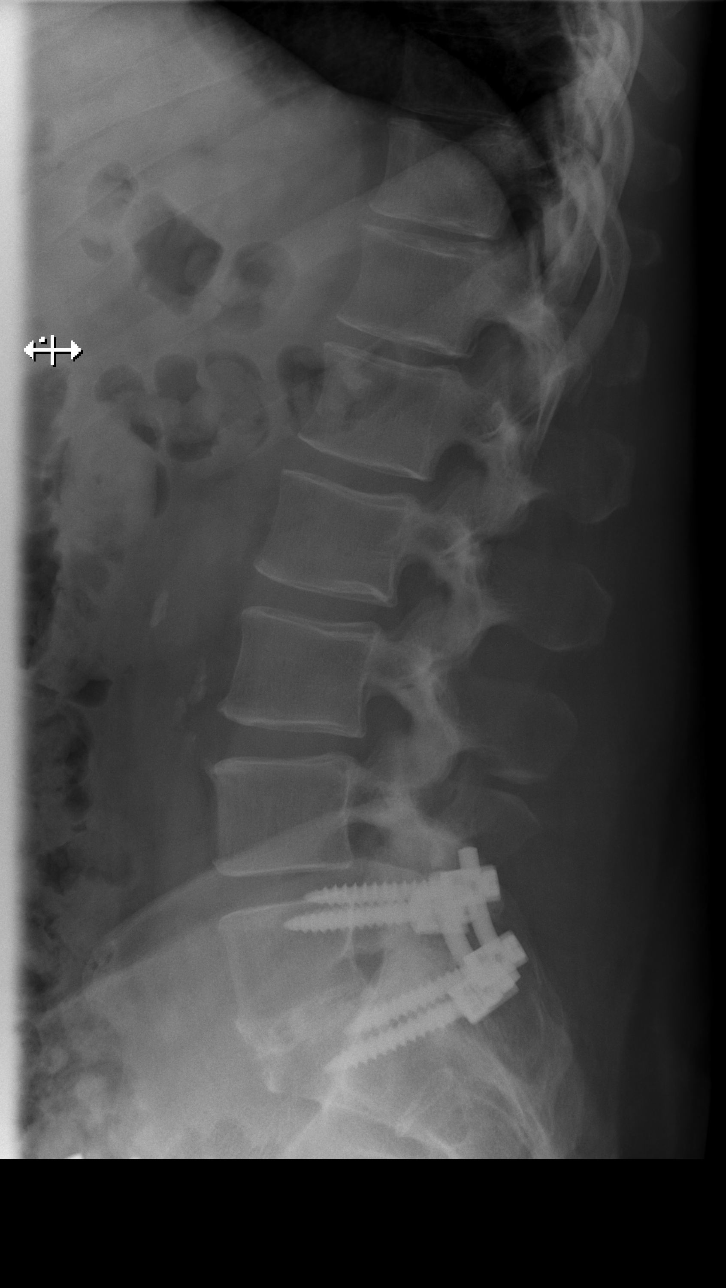

[t lumbar l-5 s-1 spot]
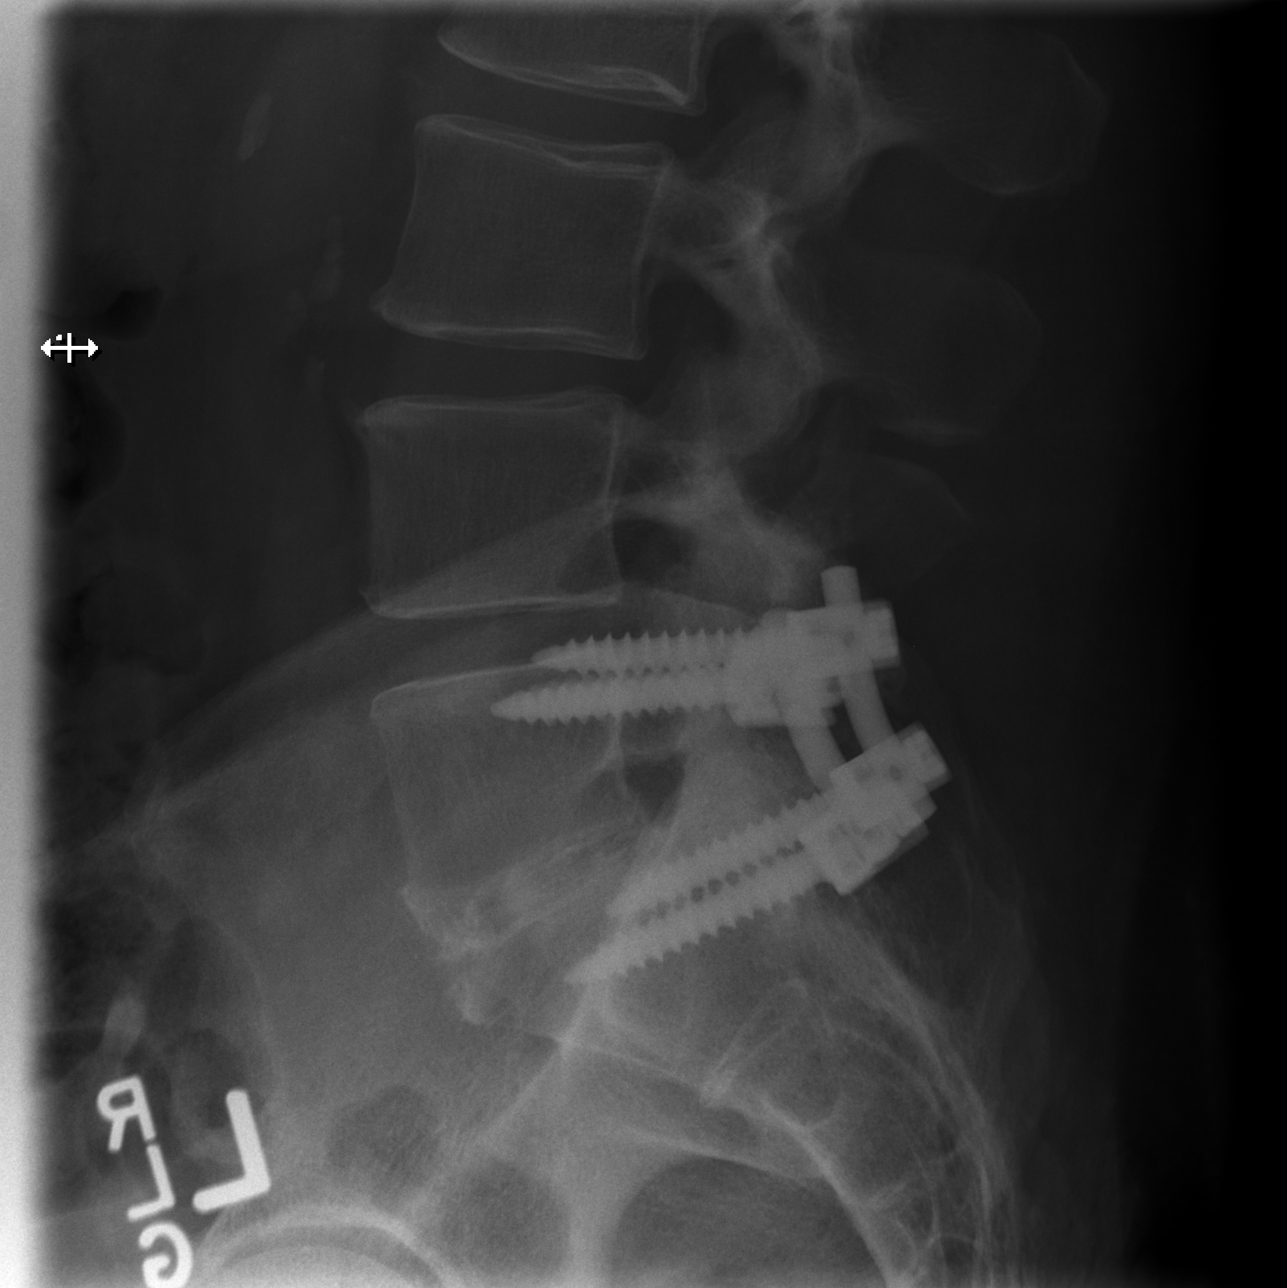

[5 of 5 positions shown; findings below may reference images not displayed]

FINDINGS: There are 5 non rib-bearing lumbar type vertebral bodies.

Normal alignment of the lumbar spine. No anterolisthesis or
retrolisthesis. No definite pars defects.

Post L5-S1 paraspinal fusion without definite evidence of hardware
failure or loosening.

Lumbar vertebral body heights are preserved.

There is mild multilevel lumbar spine DDD, likely worse at L2-L3 and
L3-L4 with disc space height loss, endplate irregularity and
sclerosis.

Limited visualization of the bilateral SI joints is normal.
Suspected mild degenerative change of the bilateral hips, right
greater than left with joint space loss, subchondral sclerosis and
osteophytosis, incompletely evaluated.

Atherosclerotic plaque within the abdominal aorta. Post
cholecystectomy. Several phleboliths overlie the lower pelvis
bilaterally, left greater than right.
IMPRESSION: 1. No acute findings.
2. Similar appearance of prior L5-S1 paraspinal fusion without
definite evidence of hardware failure loosening.
3. Mild multilevel lumbar spine DDD.
